# Patient Record
Sex: Female | Born: 1980 | Race: Black or African American | Hispanic: No | Marital: Single | Smoking: Current every day smoker
Health system: Southern US, Community
[De-identification: ages and names within clinical notes are randomized; demographics above are authoritative.]

## PROBLEM LIST (undated history)

## (undated) DIAGNOSIS — D649 Anemia, unspecified: Secondary | ICD-10-CM

## (undated) DIAGNOSIS — J45909 Unspecified asthma, uncomplicated: Secondary | ICD-10-CM

## (undated) DIAGNOSIS — I10 Essential (primary) hypertension: Secondary | ICD-10-CM

## (undated) HISTORY — PX: FRACTURE SURGERY: SHX138

## (undated) HISTORY — PX: TUBAL LIGATION: SHX77

---

## 2015-03-05 DIAGNOSIS — M5416 Radiculopathy, lumbar region: Secondary | ICD-10-CM | POA: Insufficient documentation

## 2015-03-05 DIAGNOSIS — G8929 Other chronic pain: Secondary | ICD-10-CM | POA: Insufficient documentation

## 2015-09-04 DIAGNOSIS — W19XXXA Unspecified fall, initial encounter: Secondary | ICD-10-CM | POA: Insufficient documentation

## 2019-02-10 ENCOUNTER — Encounter: Payer: Self-pay | Admitting: Intensive Care

## 2019-02-10 ENCOUNTER — Emergency Department
Admission: EM | Admit: 2019-02-10 | Discharge: 2019-02-11 | Disposition: A | Discharge status: Home / Self Care | Payer: BC Managed Care – PPO | Attending: Emergency Medicine | Admitting: Emergency Medicine

## 2019-02-10 ENCOUNTER — Other Ambulatory Visit: Payer: Self-pay

## 2019-02-10 DIAGNOSIS — Z202 Contact with and (suspected) exposure to infections with a predominantly sexual mode of transmission: Secondary | ICD-10-CM | POA: Diagnosis present

## 2019-02-10 DIAGNOSIS — A599 Trichomoniasis, unspecified: Secondary | ICD-10-CM | POA: Diagnosis not present

## 2019-02-10 LAB — URINALYSIS, COMPLETE (UACMP) WITH MICROSCOPIC
Bilirubin Urine: NEGATIVE
Glucose, UA: NEGATIVE mg/dL
Ketones, ur: NEGATIVE mg/dL
Nitrite: NEGATIVE
Protein, ur: NEGATIVE mg/dL
Specific Gravity, Urine: 1.024 (ref 1.005–1.030)
pH: 6 (ref 5.0–8.0)

## 2019-02-10 LAB — WET PREP, GENITAL
Sperm: NONE SEEN
Yeast Wet Prep HPF POC: NONE SEEN

## 2019-02-10 LAB — POCT PREGNANCY, URINE: Preg Test, Ur: NEGATIVE

## 2019-02-10 MED ORDER — ACETAMINOPHEN 325 MG PO TABS
650.0000 mg | ORAL_TABLET | Freq: Once | ORAL | Status: AC
  Administered 2019-02-10: 650 mg via ORAL
  Filled 2019-02-10: qty 2

## 2019-02-10 MED ORDER — METRONIDAZOLE 500 MG PO TABS
2000.0000 mg | ORAL_TABLET | Freq: Once | ORAL | Status: AC
  Administered 2019-02-10: 2000 mg via ORAL
  Filled 2019-02-10: qty 4

## 2019-02-10 MED ORDER — AZITHROMYCIN 500 MG PO TABS
1000.0000 mg | ORAL_TABLET | Freq: Once | ORAL | Status: AC
  Administered 2019-02-10: 1000 mg via ORAL
  Filled 2019-02-10: qty 2

## 2019-02-10 MED ORDER — CEFTRIAXONE SODIUM 250 MG IJ SOLR
250.0000 mg | Freq: Once | INTRAMUSCULAR | Status: AC
  Administered 2019-02-10: 250 mg via INTRAMUSCULAR
  Filled 2019-02-10: qty 250

## 2019-02-10 NOTE — ED Triage Notes (Signed)
PAtient c/o lower back pain and white discharge. Patient states she needs to be checked for gonorrhea. Partner told her yesterday he was diagnosed with gonorrhea.

## 2019-02-10 NOTE — ED Provider Notes (Signed)
[facility] Emergency Department Provider Note  ____________________________________________  Time seen: Approximately 9:08 PM  I have reviewed the triage vital signs and the nursing notes.   HISTORY  Chief Complaint Exposure to STD    HPI Sheila Valentine is a 38 y.o. female presents to the emergency department with new onset low back pain and increased vaginal discharge.  Patient states that her partner of 1 year has recently been diagnosed with gonorrhea.  Patient denies fever, chills, dyspareunia, dysuria, increased urinary frequency, abdominal pain or nausea.  She has not experienced similar symptoms in the past.  No other alleviating measures have been attempted.        History reviewed. No pertinent past medical history.  There are no active problems to display for this patient.   History reviewed. No pertinent surgical history.  Prior to Admission medications   Not on File    Allergies Penicillins  History reviewed. No pertinent family history.  Social History Social History   Tobacco Use  . Smoking status: Current Every Day Smoker    Types: Cigars  . Smokeless tobacco: Never Used  Substance Use Topics  . Alcohol use: Yes    Comment: occ  . Drug use: Never     Review of Systems  Constitutional: No fever/chills Eyes: No visual changes. No discharge ENT: No upper respiratory complaints. Cardiovascular: no chest pain. Respiratory: no cough. No SOB. Gastrointestinal: No abdominal pain.  No nausea, no vomiting.  No diarrhea.  No constipation. Genitourinary: Negative for dysuria. No hematuria. Patient has increased vaginal discharge.  Musculoskeletal: Patient has low back pain.  Skin: Negative for rash, abrasions, lacerations, ecchymosis. Neurological: Negative for headaches, focal weakness or numbness.   ____________________________________________   PHYSICAL EXAM:  VITAL SIGNS: ED Triage Vitals  Enc Vitals Group     BP  02/10/19 1740 136/83     Pulse Rate 02/10/19 1740 97     Resp 02/10/19 1740 16     Temp --      Temp src --      SpO2 02/10/19 1740 100 %     Weight 02/10/19 1736 165 lb (74.8 kg)     Height 02/10/19 1736 5\' 2"  (1.575 m)     Head Circumference --      Peak Flow --      Pain Score 02/10/19 1736 7     Pain Loc --      Pain Edu? --      Excl. in [place]? --      Constitutional: Alert and oriented. Well appearing and in no acute distress. Eyes: Conjunctivae are normal. PERRL. EOMI. Head: Atraumatic. Cardiovascular: Normal rate, regular rhythm. Normal S1 and S2.  Good peripheral circulation. Respiratory: Normal respiratory effort without tachypnea or retractions. Lungs CTAB. Good air entry to the bases with no decreased or absent breath sounds. Gastrointestinal: Bowel sounds 4 quadrants. Soft and nontender to palpation. No guarding or rigidity. No palpable masses. No distention. No CVA tenderness. Genitourinary: Patient has copious frothy white vaginal discharge. Musculoskeletal: Full range of motion to all extremities. No gross deformities appreciated. Neurologic:  Normal speech and language. No gross focal neurologic deficits are appreciated.  Skin:  Skin is warm, dry and intact. No rash noted. Psychiatric: Mood and affect are normal. Speech and behavior are normal. Patient exhibits appropriate insight and judgement.   ____________________________________________   LABS (all labs ordered are listed, but only abnormal results are displayed)  Labs Reviewed  WET PREP, GENITAL - Abnormal;  Notable for the following components:      Result Value   Trich, Wet Prep PRESENT (*)    Clue Cells Wet Prep HPF POC PRESENT (*)    WBC, Wet Prep HPF POC MODERATE (*)    All other components within normal limits  URINALYSIS, COMPLETE (UACMP) WITH MICROSCOPIC - Abnormal; Notable for the following components:   Color, Urine YELLOW (*)    APPearance HAZY (*)    Hgb urine dipstick SMALL (*)     Leukocytes,Ua TRACE (*)    Bacteria, UA RARE (*)    All other components within normal limits  CHLAMYDIA/NGC RT PCR ([facility] ONLY)  POC URINE PREG, ED  POCT PREGNANCY, URINE   ____________________________________________  EKG   ____________________________________________  RADIOLOGY   No results found.  ____________________________________________    PROCEDURES  Procedure(s) performed:    Procedures    Medications  cefTRIAXone (ROCEPHIN) injection 250 mg (250 mg Intramuscular Given 02/10/19 2226)  azithromycin (ZITHROMAX) tablet 1,000 mg (1,000 mg Oral Given 02/10/19 2225)  metroNIDAZOLE (FLAGYL) tablet 2,000 mg (2,000 mg Oral Given 02/10/19 2224)  acetaminophen (TYLENOL) tablet 650 mg (650 mg Oral Given 02/10/19 2224)     ____________________________________________   INITIAL IMPRESSION / ASSESSMENT AND PLAN / ED COURSE  Pertinent labs & imaging results that were available during my care of the patient were reviewed by me and considered in my medical decision making (see chart for details).  Review of the [place] CSRS was performed in accordance of the [place] prior to dispensing any controlled drugs.           Assessment and plan:  Exposure to STD:   38 year old female presents to the emergency department with increased vaginal discharge and low back pain for the past 2 to 3 days.  She states that her boyfriend recently tested positive for gonorrhea.  On physical exam, patient appeared to be resting comfortably.  Vital signs were reassuring.  Differential diagnosis included cystitis, STD, PID.Marland KitchenMarland Kitchen  Urinalysis was concerning with a small amount of blood and trace leuks.  Patient tested positive for trichomoniasis in the emergency department.  Patient opted to be tested empirically for gonorrhea, chlamydia and trichomoniasis with Rocephin, azithromycin and Flagyl.  Patient was advised to undergo repeat STD testing in 2 weeks.  All patient questions were  answered.  ____________________________________________  FINAL CLINICAL IMPRESSION(S) / ED DIAGNOSES  Final diagnoses:  Trichimoniasis  STD exposure      NEW MEDICATIONS STARTED DURING THIS VISIT:  ED Discharge Orders    None          This chart was dictated using voice recognition software/Dragon. Despite best efforts to proofread, errors can occur which can change the meaning. Any change was purely unintentional.    Lannie Fields, PA-C 02/10/19 2327    Delman Kitten, MD 02/11/19 [id number]

## 2019-02-11 LAB — CHLAMYDIA/NGC RT PCR (ARMC ONLY)
Chlamydia Tr: NOT DETECTED
N gonorrhoeae: NOT DETECTED

## 2020-04-30 ENCOUNTER — Other Ambulatory Visit: Payer: Self-pay

## 2020-04-30 ENCOUNTER — Emergency Department: Payer: Medicaid Other

## 2020-04-30 DIAGNOSIS — D259 Leiomyoma of uterus, unspecified: Secondary | ICD-10-CM | POA: Diagnosis not present

## 2020-04-30 DIAGNOSIS — F17290 Nicotine dependence, other tobacco product, uncomplicated: Secondary | ICD-10-CM | POA: Insufficient documentation

## 2020-04-30 DIAGNOSIS — Z20822 Contact with and (suspected) exposure to covid-19: Secondary | ICD-10-CM | POA: Diagnosis not present

## 2020-04-30 DIAGNOSIS — N920 Excessive and frequent menstruation with regular cycle: Secondary | ICD-10-CM | POA: Diagnosis not present

## 2020-04-30 DIAGNOSIS — N938 Other specified abnormal uterine and vaginal bleeding: Principal | ICD-10-CM | POA: Insufficient documentation

## 2020-04-30 DIAGNOSIS — R102 Pelvic and perineal pain: Secondary | ICD-10-CM | POA: Insufficient documentation

## 2020-04-30 DIAGNOSIS — D649 Anemia, unspecified: Secondary | ICD-10-CM | POA: Diagnosis not present

## 2020-04-30 DIAGNOSIS — I10 Essential (primary) hypertension: Secondary | ICD-10-CM | POA: Insufficient documentation

## 2020-04-30 DIAGNOSIS — N939 Abnormal uterine and vaginal bleeding, unspecified: Secondary | ICD-10-CM | POA: Diagnosis present

## 2020-04-30 LAB — CBC
HCT: 32.1 % — ABNORMAL LOW (ref 36.0–46.0)
Hemoglobin: 9.7 g/dL — ABNORMAL LOW (ref 12.0–15.0)
MCH: 23.5 pg — ABNORMAL LOW (ref 26.0–34.0)
MCHC: 30.2 g/dL (ref 30.0–36.0)
MCV: 77.9 fL — ABNORMAL LOW (ref 80.0–100.0)
Platelets: 409 10*3/uL — ABNORMAL HIGH (ref 150–400)
RBC: 4.12 MIL/uL (ref 3.87–5.11)
RDW: 17.8 % — ABNORMAL HIGH (ref 11.5–15.5)
WBC: 6.8 10*3/uL (ref 4.0–10.5)
nRBC: 0 % (ref 0.0–0.2)

## 2020-04-30 LAB — COMPREHENSIVE METABOLIC PANEL
ALT: 14 U/L (ref 0–44)
AST: 21 U/L (ref 15–41)
Albumin: 3.9 g/dL (ref 3.5–5.0)
Alkaline Phosphatase: 47 U/L (ref 38–126)
Anion gap: 9 (ref 5–15)
BUN: 14 mg/dL (ref 6–20)
CO2: 25 mmol/L (ref 22–32)
Calcium: 9.3 mg/dL (ref 8.9–10.3)
Chloride: 105 mmol/L (ref 98–111)
Creatinine, Ser: 0.85 mg/dL (ref 0.44–1.00)
GFR calc Af Amer: >60 mL/min (ref >60)
GFR calc non Af Amer: >60 mL/min (ref >60)
Glucose, Bld: 102 mg/dL — ABNORMAL HIGH (ref 70–99)
Potassium: 3.5 mmol/L (ref 3.5–5.1)
Sodium: 139 mmol/L (ref 135–145)
Total Bilirubin: 0.6 mg/dL (ref 0.3–1.2)
Total Protein: 7.9 g/dL (ref 6.5–8.1)

## 2020-04-30 LAB — HCG, QUANTITATIVE, PREGNANCY: hCG, Beta Chain, Quant, S: <1 m[IU]/mL (ref <5)

## 2020-04-30 MED ORDER — IBUPROFEN 800 MG PO TABS
800.0000 mg | ORAL_TABLET | Freq: Once | ORAL | Status: AC
  Administered 2020-04-30: 800 mg via ORAL

## 2020-04-30 NOTE — ED Triage Notes (Signed)
Pt comes POV with vaginal bleeding since last night. Pt states gushing of blood everytime she stands with cramping. Pt had tubes tied 17 years ago. Pt abdomen swollen and round, states that it is not normally like this.

## 2020-05-01 ENCOUNTER — Observation Stay
Admission: EM | Admit: 2020-05-01 | Discharge: 2020-05-02 | Disposition: A | Discharge status: Home / Self Care | Payer: Medicaid Other | Attending: Obstetrics and Gynecology | Admitting: Obstetrics and Gynecology

## 2020-05-01 ENCOUNTER — Encounter: Payer: Self-pay | Admitting: Obstetrics and Gynecology

## 2020-05-01 DIAGNOSIS — N938 Other specified abnormal uterine and vaginal bleeding: Secondary | ICD-10-CM

## 2020-05-01 DIAGNOSIS — N939 Abnormal uterine and vaginal bleeding, unspecified: Secondary | ICD-10-CM

## 2020-05-01 DIAGNOSIS — D259 Leiomyoma of uterus, unspecified: Secondary | ICD-10-CM

## 2020-05-01 DIAGNOSIS — N920 Excessive and frequent menstruation with regular cycle: Secondary | ICD-10-CM | POA: Diagnosis present

## 2020-05-01 LAB — CBC
HCT: 32.7 % — ABNORMAL LOW (ref 36.0–46.0)
Hemoglobin: 10.6 g/dL — ABNORMAL LOW (ref 12.0–15.0)
MCH: 24.7 pg — ABNORMAL LOW (ref 26.0–34.0)
MCHC: 32.4 g/dL (ref 30.0–36.0)
MCV: 76 fL — ABNORMAL LOW (ref 80.0–100.0)
Platelets: 306 10*3/uL (ref 150–400)
RBC: 4.3 MIL/uL (ref 3.87–5.11)
RDW: 18.6 % — ABNORMAL HIGH (ref 11.5–15.5)
WBC: 5.1 10*3/uL (ref 4.0–10.5)
nRBC: 0 % (ref 0.0–0.2)

## 2020-05-01 LAB — BASIC METABOLIC PANEL
Anion gap: 8 (ref 5–15)
BUN: 16 mg/dL (ref 6–20)
CO2: 23 mmol/L (ref 22–32)
Calcium: 8.6 mg/dL — ABNORMAL LOW (ref 8.9–10.3)
Chloride: 104 mmol/L (ref 98–111)
Creatinine, Ser: 0.87 mg/dL (ref 0.44–1.00)
GFR calc Af Amer: >60 mL/min (ref >60)
GFR calc non Af Amer: >60 mL/min (ref >60)
Glucose, Bld: 103 mg/dL — ABNORMAL HIGH (ref 70–99)
Potassium: 3.8 mmol/L (ref 3.5–5.1)
Sodium: 135 mmol/L (ref 135–145)

## 2020-05-01 LAB — FERRITIN: Ferritin: 4 ng/mL — ABNORMAL LOW (ref 11–307)

## 2020-05-01 LAB — TYPE AND SCREEN
ABO/RH(D): AB POS
Antibody Screen: NEGATIVE
PT AG Type: NEGATIVE
Unit division: 0
Unit division: 0

## 2020-05-01 LAB — BPAM RBC
Blood Product Expiration Date: 202109252359
Blood Product Expiration Date: 202109302359
Unit Type and Rh: 9500
Unit Type and Rh: 9500

## 2020-05-01 LAB — WET PREP, GENITAL
Clue Cells Wet Prep HPF POC: NONE SEEN
Sperm: NONE SEEN
Trich, Wet Prep: NONE SEEN
Yeast Wet Prep HPF POC: NONE SEEN

## 2020-05-01 LAB — SARS CORONAVIRUS 2 BY RT PCR (HOSPITAL ORDER, PERFORMED IN ~~LOC~~ HOSPITAL LAB): SARS Coronavirus 2: NEGATIVE

## 2020-05-01 LAB — HEMOGLOBIN AND HEMATOCRIT, BLOOD
HCT: 28.8 % — ABNORMAL LOW (ref 36.0–46.0)
Hemoglobin: 8.6 g/dL — ABNORMAL LOW (ref 12.0–15.0)

## 2020-05-01 LAB — POCT PREGNANCY, URINE: Preg Test, Ur: NEGATIVE

## 2020-05-01 LAB — PREPARE RBC (CROSSMATCH)

## 2020-05-01 MED ORDER — KETOROLAC TROMETHAMINE 30 MG/ML IJ SOLN
30.0000 mg | Freq: Three times a day (TID) | INTRAMUSCULAR | Status: DC | PRN
  Administered 2020-05-01 – 2020-05-02 (×2): 30 mg via INTRAVENOUS
  Filled 2020-05-01 (×2): qty 1

## 2020-05-01 MED ORDER — TRANEXAMIC ACID 650 MG PO TABS
1300.0000 mg | ORAL_TABLET | Freq: Three times a day (TID) | ORAL | Status: DC
  Administered 2020-05-01 – 2020-05-02 (×3): 1300 mg via ORAL
  Filled 2020-05-01 (×5): qty 2

## 2020-05-01 MED ORDER — SODIUM CHLORIDE 0.9 % IV SOLN: Freq: Once | INTRAVENOUS | Status: AC

## 2020-05-01 MED ORDER — PRENATAL MULTIVITAMIN CH
1.0000 | ORAL_TABLET | Freq: Every day | ORAL | Status: DC
  Administered 2020-05-02: 1 via ORAL
  Filled 2020-05-01: qty 1

## 2020-05-01 MED ORDER — KETOROLAC TROMETHAMINE 30 MG/ML IJ SOLN
15.0000 mg | Freq: Once | INTRAMUSCULAR | Status: AC
  Administered 2020-05-01: 15 mg via INTRAVENOUS
  Filled 2020-05-01: qty 1

## 2020-05-01 MED ORDER — HYDROCODONE-ACETAMINOPHEN 5-325 MG PO TABS
1.0000 | ORAL_TABLET | ORAL | Status: DC | PRN
  Administered 2020-05-01 – 2020-05-02 (×7): 1 via ORAL
  Filled 2020-05-01 (×7): qty 1

## 2020-05-01 MED ORDER — ACETAMINOPHEN 325 MG PO TABS
650.0000 mg | ORAL_TABLET | Freq: Once | ORAL | Status: AC
  Administered 2020-05-01: 650 mg via ORAL
  Filled 2020-05-01 (×2): qty 2

## 2020-05-01 MED ORDER — PNEUMOCOCCAL VAC POLYVALENT 25 MCG/0.5ML IJ INJ
0.5000 mL | INJECTION | INTRAMUSCULAR | Status: DC
  Filled 2020-05-01: qty 0.5

## 2020-05-01 MED ORDER — TRANEXAMIC ACID-NACL 1000-0.7 MG/100ML-% IV SOLN
1000.0000 mg | Freq: Once | INTRAVENOUS | Status: AC
  Administered 2020-05-01: 1000 mg via INTRAVENOUS
  Filled 2020-05-01: qty 100

## 2020-05-01 MED ORDER — DIPHENHYDRAMINE HCL 50 MG/ML IJ SOLN
25.0000 mg | Freq: Once | INTRAMUSCULAR | Status: AC
  Administered 2020-05-01: 25 mg via INTRAVENOUS
  Filled 2020-05-01: qty 1

## 2020-05-01 MED ORDER — TRANEXAMIC ACID-NACL 1000-0.7 MG/100ML-% IV SOLN
1000.0000 mg | Freq: Once | INTRAVENOUS | Status: DC
  Filled 2020-05-01: qty 100
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
## 2020-05-01 NOTE — Progress Notes (Signed)
1st unit pRBC started 0744.  Delayed due to pt losing blood bank bracelet & needing to redraw type/screen.

## 2020-05-01 NOTE — Progress Notes (Signed)
Subjective:admitted early this am for menorrhagia and symptomatic anemia . She has received 2 units of blood . Still with some bleeding . HCT 32% now post transfusion Patient reports cramping  .    Objective: I have reviewed patient's vital signs, intake and output, medications and labs.  GI: soft, non-tender; bowel sounds normal; no masses,  no organomegaly   Assessment/Plan:   LOS: 0 days   Blood count up since blood tranfsuion Offer nicoderm patch   add lysteda 1300 mg po tid  Goal d/c tomorrow with outpt workup Hysterectomy . Needs pap and embx   cheeck CBC in am    Sheila Valentine Emmanuela Ghazi 05/01/2020, 5:26 PM

## 2020-05-01 NOTE — ED Notes (Signed)
Messaged MD regarding pt's pain

## 2020-05-01 NOTE — Discharge Summary (Signed)
Physician Discharge Summary  Patient ID: Sheila Valentine MRN: [id number] DOB/AGE: December 13, 1980 39 y.o.  Admit date: 05/01/2020 Discharge date:05/02/2020  Admission Diagnoses: Symptomatic anemia Discharge Diagnoses:  Active Problems:   Menorrhagia symptomatic anemia  Discharged Condition: good  Hospital Course: pt was admitted and underwent 2 unit blood transfusion and initially was started on IV TXA transitioned to lysteda .   Consults: None  Significant Diagnostic Studies: labs:  Results for orders placed or performed during the hospital encounter of 05/01/20 (from the past 72 hour(s))  Pregnancy, urine POC     Status: None   Collection Time: 04/30/20  3:23 PM  Result Value Ref Range   Preg Test, Ur NEGATIVE NEGATIVE    Comment:        THE SENSITIVITY OF THIS METHODOLOGY IS >24 mIU/mL   CBC     Status: Abnormal   Collection Time: 04/30/20  3:24 PM  Result Value Ref Range   WBC 6.8 4.0 - 10.5 K/uL   RBC 4.12 3.87 - 5.11 MIL/uL   Hemoglobin 9.7 (L) 12.0 - 15.0 g/dL   HCT 32.1 (L) 36 - 46 %   MCV 77.9 (L) 80.0 - 100.0 fL   MCH 23.5 (L) 26.0 - 34.0 pg   MCHC 30.2 30.0 - 36.0 g/dL   RDW 17.8 (H) 11.5 - 15.5 %   Platelets 409 (H) 150 - 400 K/uL   nRBC 0.0 0.0 - 0.2 %    Comment: Performed at [facility], [address] [place], [place] [address]  Comprehensive metabolic panel     Status: Abnormal   Collection Time: 04/30/20  3:24 PM  Result Value Ref Range   Sodium 139 135 - 145 mmol/L   Potassium 3.5 3.5 - 5.1 mmol/L   Chloride 105 98 - 111 mmol/L   CO2 25 22 - 32 mmol/L   Glucose, Bld 102 (H) 70 - 99 mg/dL    Comment: Glucose reference range applies only to samples taken after fasting for at least 8 hours.   BUN 14 6 - 20 mg/dL   Creatinine, Ser 0.85 0.44 - 1.00 mg/dL   Calcium 9.3 8.9 - 10.3 mg/dL   Total Protein 7.9 6.5 - 8.1 g/dL   Albumin 3.9 3.5 - 5.0 g/dL   AST 21 15 - 41 U/L   ALT 14 0 - 44 U/L   Alkaline Phosphatase 47 38 - 126 U/L   Total  Bilirubin 0.6 0.3 - 1.2 mg/dL   GFR calc non Af Amer >60 >60 mL/min   GFR calc Af Amer >60 >60 mL/min   Anion gap 9 5 - 15    Comment: Performed at [facility], [address] [place], [place] [address]  Type and screen [place]     Status: None   Collection Time: 04/30/20  3:24 PM  Result Value Ref Range   ABO/RH(D) AB POS    Antibody Screen NEG    Sample Expiration 05/03/2020,2359    Antibody Identification ANTI A1    PT AG Type NEGATIVE FOR A1 ANTIGEN    Unit Number [id number]    Blood Component Type RED CELLS,LR    Unit division 00    Status of Unit REL FROM [facility]    Transfusion Status OK TO TRANSFUSE    Crossmatch Result COMPATIBLE    Unit Number [id number]    Blood Component Type RBC LR PHER1    Unit division 00    Status of Unit REL FROM [facility]  Transfusion Status OK TO TRANSFUSE    Crossmatch Result COMPATIBLE   hCG, quantitative, pregnancy     Status: None   Collection Time: 04/30/20  3:24 PM  Result Value Ref Range   hCG, Beta Chain, Quant, S <1 <5 mIU/mL    Comment:          GEST. AGE      CONC.  (mIU/mL)   <=1 WEEK        5 - 50     2 WEEKS       50 - 500     3 WEEKS       100 - 10,000     4 WEEKS     1,000 - 30,000     5 WEEKS     3,500 - 115,000   6-8 WEEKS     12,000 - 270,000    12 WEEKS     15,000 - 220,000        FEMALE AND NON-PREGNANT FEMALE:     LESS THAN 5 mIU/mL Performed at [facility], [address] [place], [place] [address]   Hemoglobin and hematocrit, blood     Status: Abnormal   Collection Time: 05/01/20 12:32 AM  Result Value Ref Range   Hemoglobin 8.6 (L) 12.0 - 15.0 g/dL   HCT 28.8 (L) 36 - 46 %    Comment: Performed at [facility], [address] [place], [place] [address]  Wet prep, genital     Status: Abnormal   Collection Time: 05/01/20  1:08 AM  Result Value Ref Range   Yeast Wet Prep HPF POC NONE SEEN NONE SEEN   Trich, Wet Prep NONE SEEN NONE SEEN   Clue Cells  Wet Prep HPF POC NONE SEEN NONE SEEN   WBC, Wet Prep HPF POC RARE (A) NONE SEEN   Sperm NONE SEEN     Comment: Performed at [facility], [address], [place],  [address]  SARS Coronavirus 2 by RT PCR (hospital order, performed in [facility] hospital lab) Nasopharyngeal Nasopharyngeal Swab     Status: None   Collection Time: 05/01/20  2:12 AM   Specimen: Nasopharyngeal Swab  Result Value Ref Range   SARS Coronavirus 2 NEGATIVE NEGATIVE    Comment: (NOTE) SARS-CoV-2 target nucleic acids are NOT DETECTED.  The SARS-CoV-2 RNA is generally detectable in upper and lower respiratory specimens during the acute phase of infection. The lowest concentration of SARS-CoV-2 viral copies this assay can detect is 250 copies / mL. A negative result does not preclude SARS-CoV-2 infection and should not be used as the sole basis for treatment or other patient management decisions.  A negative result may occur with improper specimen collection / handling, submission of specimen other than nasopharyngeal swab, presence of viral mutation(s) within the areas targeted by this assay, and inadequate number of viral copies (<250 copies / mL). A negative result must be combined with clinical observations, patient history, and epidemiological information.  Fact Sheet for Patients:   StrictlyIdeas.no  Fact Sheet for Healthcare Providers: BankingDealers.co.za  This test is not yet approved or  cleared by the [place] FDA and has been authorized for detection and/or diagnosis of SARS-CoV-2 by FDA under an Emergency Use Authorization (EUA).  This EUA will remain in effect (meaning this test can be used) for the duration of the COVID-19 declaration under Section 564(b)(1) of the Act, 21 U.S.C. section 360bbb-3(b)(1), unless the authorization is terminated or revoked sooner.  Performed at [facility], [address]  [place], [place] [address]   Prepare RBC (crossmatch)     Status: None   Collection Time: 05/01/20  2:26 AM  Result Value Ref Range   Order Confirmation      ORDER PROCESSED BY BLOOD BANK Performed at [facility], [address] [place], [place] [address]   Type and screen [place]     Status: None   Collection Time: 05/01/20  5:41 AM  Result Value Ref Range   ABO/RH(D) AB POS    Antibody Screen NEG    Sample Expiration 05/04/2020,2359    Unit Number [id number]    Blood Component Type RBC LR PHER1    Unit division 00    Status of Unit ISSUED,FINAL    Transfusion Status OK TO TRANSFUSE    Crossmatch Result COMPATIBLE    Unit Number [id number]    Blood Component Type RED CELLS,LR    Unit division 00    Status of Unit ISSUED,FINAL    Transfusion Status OK TO TRANSFUSE    Crossmatch Result COMPATIBLE   Basic metabolic panel     Status: Abnormal   Collection Time: 05/01/20  4:02 PM  Result Value Ref Range   Sodium 135 135 - 145 mmol/L   Potassium 3.8 3.5 - 5.1 mmol/L   Chloride 104 98 - 111 mmol/L   CO2 23 22 - 32 mmol/L   Glucose, Bld 103 (H) 70 - 99 mg/dL    Comment: Glucose reference range applies only to samples taken after fasting for at least 8 hours.   BUN 16 6 - 20 mg/dL   Creatinine, Ser 0.87 0.44 - 1.00 mg/dL   Calcium 8.6 (L) 8.9 - 10.3 mg/dL   GFR calc non Af Amer >60 >60 mL/min   GFR calc Af Amer >60 >60 mL/min   Anion gap 8 5 - 15    Comment: Performed at [facility], [address] [place], [place] [address]  CBC     Status: Abnormal   Collection Time: 05/01/20  4:02 PM  Result Value Ref Range   WBC 5.1 4.0 - 10.5 K/uL   RBC 4.30 3.87 - 5.11 MIL/uL   Hemoglobin 10.6 (L) 12.0 - 15.0 g/dL   HCT 32.7 (L) 36 - 46 %   MCV 76.0 (L) 80.0 - 100.0 fL   MCH 24.7 (L) 26.0 - 34.0 pg   MCHC 32.4 30.0 - 36.0 g/dL   RDW 18.6 (H) 11.5 - 15.5 %   Platelets 306 150 - 400 K/uL   nRBC 0.0 0.0 - 0.2 %    Comment: Performed  at [facility], [address] [place], [place] [address]  Ferritin     Status: Abnormal   Collection Time: 05/01/20  4:02 PM  Result Value Ref Range   Ferritin 4 (L) 11 - 307 ng/mL    Comment: Performed at [facility], [address] [place],  [address]  CBC     Status: Abnormal   Collection Time: 05/02/20  6:31 AM  Result Value Ref Range   WBC 6.1 4.0 - 10.5 K/uL   RBC 4.58 3.87 - 5.11 MIL/uL   Hemoglobin 11.3 (L) 12.0 - 15.0 g/dL   HCT 34.5 (L) 36 - 46 %   MCV 75.3 (L) 80.0 - 100.0 fL   MCH 24.7 (L) 26.0 - 34.0 pg   MCHC 32.8 30.0 - 36.0 g/dL   RDW 18.4 (H)  11.5 - 15.5 %   Platelets 304 150 - 400 K/uL   nRBC 0.0 0.0 - 0.2 %    Comment: Performed at [facility], [address] [place],  [address]    Treatments: blood transfusion  Discharge Exam: Blood pressure 140/86, pulse 66, temperature 98.4 F (36.9 C), temperature source Oral, resp. rate 18, height 5\' 2"  (1.575 m), weight 77.1 kg, SpO2 100 %. physical Lungs CTA   Cv RRR  abd soft NT  Disposition:  D/c home  FUP at [facility] Monday 05/05/20 for EMBX / PAP and schedule surgery   Allergies as of 05/02/2020      Reactions   Amoxicillin Hives   Metronidazole Hives   Penicillins    rash      Medication List    TAKE these medications   albuterol 108 (90 Base) MCG/ACT inhaler Commonly known as: VENTOLIN HFA Inhale 2 puffs into the lungs every 4 (four) hours as needed.   ferrous sulfate 325 (65 FE) MG tablet Take 1 tablet (325 mg total) by mouth 2 (two) times daily with a meal.   tranexamic acid 650 MG Tabs tablet Commonly known as: LYSTEDA Take 2 tablets (1,300 mg total) by mouth 3 (three) times daily for 3 days.       Follow-up Information    Vida Nicol, Gwen Her, MD Follow up on 05/05/2020.   Specialty: Obstetrics and Gynecology Why: 0845 AT [facility] information: [address] [place]  [address] [phone]               Signed: Gwen Her Brigido Mera 05/02/2020, 12:58 PM

## 2020-05-01 NOTE — ED Provider Notes (Signed)
[facility] Emergency Department Provider Note  ____________________________________________  Time seen: Approximately 1:23 AM  I have reviewed the triage vital signs and the nursing notes.   HISTORY  Chief Complaint Vaginal Bleeding   HPI Sheila Valentine is a 39 y.o. female history of hypertension presents for evaluation of vaginal bleeding.  Symptoms have been ongoing for 24 hours.  Patient reports large amount of bleeding and clots every time she stands up or goes to the bathroom.  She has been feeling dizzy.  She denies any prior history of abnormal uterine bleeding.  Patient denies any known history of coagulopathy or bleeding disorder.  She does have a strong family history of blood clots.  Patient is also complaining of abdominal distention and diffuse cramping severe and constant abdominal pain.   PMH HTN  Allergies Penicillins  FH Mother - DVT  Social History Social History   Tobacco Use   Smoking status: Current Every Day Smoker    Types: Cigars   Smokeless tobacco: Never Used  Substance Use Topics   Alcohol use: Yes    Comment: occ   Drug use: Never    Review of Systems  Constitutional: Negative for fever. + Lightheadedness Eyes: Negative for visual changes. ENT: Negative for sore throat. Neck: No neck pain  Cardiovascular: Negative for chest pain. Respiratory: Negative for shortness of breath. Gastrointestinal: Negative for abdominal pain, vomiting or diarrhea. Genitourinary: Negative for dysuria. + Vaginal bleeding  musculoskeletal: Negative for back pain. Skin: Negative for rash. Neurological: Negative for headaches, weakness or numbness. Psych: No SI or HI  ____________________________________________   PHYSICAL EXAM:  VITAL SIGNS: ED Triage Vitals  Enc Vitals Group     BP 04/30/20 1513 127/84     Pulse Rate 04/30/20 1513 94     Resp 04/30/20 1513 18     Temp 04/30/20 1513 98.6 F (37 C)     Temp Source  04/30/20 1513 Oral     SpO2 04/30/20 1513 100 %     Weight 04/30/20 1514 170 lb (77.1 kg)     Height 04/30/20 1514 5\' 2"  (1.575 m)     Head Circumference --      Peak Flow --      Pain Score 04/30/20 1520 8     Pain Loc --      Pain Edu? --      Excl. in [place]? --     Constitutional: Alert and oriented. Well appearing and in no apparent distress. HEENT:      Head: Normocephalic and atraumatic.         Eyes: Conjunctivae are normal. Sclera is non-icteric.       Mouth/Throat: Mucous membranes are moist.       Neck: Supple with no signs of meningismus. Cardiovascular: Regular rate and rhythm. No murmurs, gallops, or rubs.  Respiratory: Normal respiratory effort. Lungs are clear to auscultation bilaterally.  Gastrointestinal: Soft, distended with lower tenderness on palpation. No rebound or guarding. Musculoskeletal:  No edema, cyanosis, or erythema of extremities. Neurologic: Normal speech and language. Face is symmetric. Moving all extremities. No gross focal neurologic deficits are appreciated. Skin: Skin is warm, dry and intact. No rash noted. Psychiatric: Mood and affect are normal. Speech and behavior are normal.  ____________________________________________   LABS (all labs ordered are listed, but only abnormal results are displayed)  Labs Reviewed  CBC - Abnormal; Notable for the following components:      Result Value   Hemoglobin 9.7 (*)  HCT 32.1 (*)    MCV 77.9 (*)    MCH 23.5 (*)    RDW 17.8 (*)    Platelets 409 (*)    All other components within normal limits  COMPREHENSIVE METABOLIC PANEL - Abnormal; Notable for the following components:   Glucose, Bld 102 (*)    All other components within normal limits  HEMOGLOBIN AND HEMATOCRIT, BLOOD - Abnormal; Notable for the following components:   Hemoglobin 8.6 (*)    HCT 28.8 (*)    All other components within normal limits  WET PREP, GENITAL  CHLAMYDIA/NGC RT PCR ([facility] ONLY)  SARS CORONAVIRUS 2 BY RT  PCR (HOSPITAL ORDER, [place] [facility])  HCG, QUANTITATIVE, PREGNANCY  POC URINE PREG, ED  TYPE AND SCREEN   ____________________________________________  EKG  none  ____________________________________________  RADIOLOGY  I have personally reviewed the images performed during this visit and I agree with the Radiologist's read.   Interpretation by Radiologist:  US PELVIC COMPLETE WITH TRANSVAGINAL  Result Date: 04/30/2020 CLINICAL DATA:  Pelvic cramping for 2 days.  Vaginal bleeding. EXAM: TRANSABDOMINAL AND TRANSVAGINAL ULTRASOUND OF PELVIS TECHNIQUE: Both transabdominal and transvaginal ultrasound examinations of the pelvis were performed. Transabdominal technique was performed for global imaging of the pelvis including uterus, ovaries, adnexal regions, and pelvic cul-de-sac. It was necessary to proceed with endovaginal exam following the transabdominal exam to visualize the ovaries and adnexa. COMPARISON:  None FINDINGS: Uterus Measurements: 13.5 x 6.2 x 8.3 cm = volume: 363 mL. There are multiple uterine fibroids. Largest fibroid is at the anterior fundus measuring 6.1 x 7.6 x 5.9 cm. Posterior fundal fibroids measured 2.6 x 2.3 x 2.3 cm and 3.2 x 2.6 x 2.8 cm. Myometrial texture is diffusely heterogeneous. Endometrium Thickness: 6-8 mm, normal.  No focal abnormality visualized. Right ovary Measurements: 3.6 x 2.0 x 2.2 cm = volume: 8.1 mL. The ovary is visualized on both transabdominal and transvaginal imaging and appears normal. Blood flow is seen. No cyst or solid mass. No adnexal mass. Left ovary Tentatively visualized transabdominally measuring 2.7 x 2.5 x 2.4 cm. Left ovary is not visualized transvaginally. There is no evidence of adnexal mass. Other findings No abnormal free fluid. IMPRESSION: 1. Enlarged fibroid uterus with largest 7.6 cm fibroid at the anterior fundus. 2. Normal endometrium. 3. Normal right ovary. Left ovary is tentatively visualized  transabdominally, and normal. No suspicious adnexal mass. Electronically Signed   By: Keith Rake M.D.   On: 04/30/2020 18:18     ____________________________________________   PROCEDURES  Procedure(s) performed:yes .1-3 Lead EKG Interpretation Performed by: Rudene Re, MD Authorized by: Rudene Re, MD     Interpretation: normal     ECG rate assessment: normal     Rhythm: sinus rhythm     Critical Care performed:  None ____________________________________________   INITIAL IMPRESSION / ASSESSMENT AND PLAN / ED COURSE  39 y.o. female history of hypertension presents for evaluation of vaginal bleeding.  Patient hemodynamically stable but hemoglobin is dropping.  Her baseline is 10.5.  Upon arrival to the ED hemoglobin found to be 9.7 with a repeat of 8.6.  On vaginal exam patient has pretty significant amount of blood in the vaginal vault with constant oozing from the os.  Due to strong family history of DVT I do not feel that giving IV TXA or high-dose birth control is the safest option for this patient.  Therefore with drop in hemoglobin, I consulted OB/GYN and spoke with Dr. Ouida Sills for admission  of the patient.  Patient is in agreement.  Discussed my plan with her.  Old medical records reviewed.       _____________________________________________ Please note:  Patient was evaluated in Emergency Department today for the symptoms described in the history of present illness. Patient was evaluated in the context of the global COVID-19 pandemic, which necessitated consideration that the patient might be at risk for infection with the SARS-CoV-2 virus that causes COVID-19. Institutional protocols and algorithms that pertain to the evaluation of patients at risk for COVID-19 are in a state of rapid change based on information released by regulatory bodies including the CDC and federal and state organizations. These policies and algorithms were followed during the  patient's care in the ED.  Some ED evaluations and interventions may be delayed as a result of limited staffing during the pandemic.   [place] Controlled Substance Database was reviewed by me. ____________________________________________   FINAL CLINICAL IMPRESSION(S) / ED DIAGNOSES   Final diagnoses:  Dysfunctional uterine bleeding  Uterine leiomyoma, unspecified location      NEW MEDICATIONS STARTED DURING THIS VISIT:  ED Discharge Orders    None       Note:  This document was prepared using Dragon voice recognition software and may include unintentional dictation errors.    Alfred Levins, [place], MD 05/01/20 [phone]

## 2020-05-01 NOTE — Progress Notes (Signed)
Pt off unit in wheelchair escorted by her sister. States she, "feel better and need to get out of this bed for some fresh air"

## 2020-05-01 NOTE — Progress Notes (Signed)
Pt has received 2 units pRBC today, Hgb 10.6 (from 8.6). Pt has been resting all day. No c/o dizziness. Cramping controlled by PRN Norco & Kpad.  Tearful when asked how she is feeling this afternoon.  "I don't want to go home and die" Now stating "I feel lightheaded" VSS, scant bleeding. Dr. Ouida Sills updated, will come round on pt this evening.

## 2020-05-01 NOTE — Progress Notes (Addendum)
1st unit pRBC stopped 1000. No c/o dizziness, SOB, or chest tightness.  Scant bleeding since admission to [facility] Provider paged to clarify orders going forward.  Waiting for response.  1056: Spoke with Dr. Ouida Sills, see order changes.

## 2020-05-02 LAB — BPAM RBC
Blood Product Expiration Date: 202110082359
Blood Product Expiration Date: 202110112359
ISSUE DATE / TIME: 202109090732
ISSUE DATE / TIME: 202109091110
Unit Type and Rh: 5100
Unit Type and Rh: 5100

## 2020-05-02 LAB — TYPE AND SCREEN
ABO/RH(D): AB POS
Antibody Screen: NEGATIVE
Unit division: 0
Unit division: 0

## 2020-05-02 LAB — CBC
HCT: 34.5 % — ABNORMAL LOW (ref 36.0–46.0)
Hemoglobin: 11.3 g/dL — ABNORMAL LOW (ref 12.0–15.0)
MCH: 24.7 pg — ABNORMAL LOW (ref 26.0–34.0)
MCHC: 32.8 g/dL (ref 30.0–36.0)
MCV: 75.3 fL — ABNORMAL LOW (ref 80.0–100.0)
Platelets: 304 10*3/uL (ref 150–400)
RBC: 4.58 MIL/uL (ref 3.87–5.11)
RDW: 18.4 % — ABNORMAL HIGH (ref 11.5–15.5)
WBC: 6.1 10*3/uL (ref 4.0–10.5)
nRBC: 0 % (ref 0.0–0.2)

## 2020-05-02 MED ORDER — FERROUS SULFATE 325 (65 FE) MG PO TABS: 325.0000 mg | ORAL_TABLET | Freq: Two times a day (BID) | ORAL | 3 refills | Status: DC

## 2020-05-02 MED ORDER — TRANEXAMIC ACID 650 MG PO TABS: 1300.0000 mg | ORAL_TABLET | Freq: Three times a day (TID) | ORAL | 1 refills | Status: AC

## 2020-05-02 NOTE — Progress Notes (Signed)
Patient discharged home. Discharge instructions, prescriptions and follow up appointment given to and reviewed with patient. Patient verbalized understanding. Pt wheeled out by auxiliary.  

## 2020-05-02 NOTE — Discharge Instructions (Signed)
Abnormal Uterine Bleeding °Abnormal uterine bleeding means bleeding more than usual from your uterus. It can include: °· Bleeding between periods. °· Bleeding after sex. °· Bleeding that is heavier than normal. °· Periods that last longer than usual. °· Bleeding after you have stopped having your period (menopause). °There are many problems that may cause this. You should see a doctor for any kind of bleeding that is not normal. Treatment depends on the cause of the bleeding. °Follow these instructions at home: °· Watch your condition for any changes. °· Do not use tampons, douche, or have sex, if your doctor tells you not to. °· Change your pads often. °· Get regular well-woman exams. Make sure they include a pelvic exam and cervical cancer screening. °· Keep all follow-up visits as told by your doctor. This is important. °Contact a doctor if: °· The bleeding lasts more than one week. °· You feel dizzy at times. °· You feel like you are going to throw up (nauseous). °· You throw up. °Get help right away if: °· You pass out. °· You have to change pads every hour. °· You have belly (abdominal) pain. °· You have a fever. °· You get sweaty. °· You get weak. °· You passing large blood clots from your vagina. °Summary °· Abnormal uterine bleeding means bleeding more than usual from your uterus. °· There are many problems that may cause this. You should see a doctor for any kind of bleeding that is not normal. °· Treatment depends on the cause of the bleeding. °This information is not intended to replace advice given to you by your health care provider. Make sure you discuss any questions you have with your health care provider. °Document Revised: 08/03/2016 Document Reviewed: 08/03/2016 °Elsevier Patient Education © 2020 [employer] ° °

## 2020-05-05 ENCOUNTER — Other Ambulatory Visit: Payer: Self-pay | Admitting: Obstetrics and Gynecology

## 2020-05-05 NOTE — H&P (Signed)
Sheila Valentine is a 39 y.o. female here for TAH and bilateral salpingectomy  Hospital followup-menorrhagia . Hospital admission last week for acute abd pain , menorrhagia and fibroid utx . Multiple fibroids noted on u/s last week Pain is not better  And bleeding has stopped on lysteda .  2 unit blood transfusion in hospital last week  For symptomatic anemia    G4P3  Pelvic u/s on 04/30/20: EXAM: TRANSABDOMINAL AND TRANSVAGINAL ULTRASOUND OF PELVIS  TECHNIQUE: Both transabdominal and transvaginal ultrasound examinations of the pelvis were performed. Transabdominal technique was performed for global imaging of the pelvis including uterus, ovaries, adnexal regions, and pelvic cul-de-sac. It was necessary to proceed with endovaginal exam following the transabdominal exam to visualize the ovaries and adnexa.  COMPARISON:  None  FINDINGS: Uterus  Measurements: 13.5 x 6.2 x 8.3 cm = volume: 363 mL. There are multiple uterine fibroids. Largest fibroid is at the anterior fundus measuring 6.1 x 7.6 x 5.9 cm. Posterior fundal fibroids measured 2.6 x 2.3 x 2.3 cm and 3.2 x 2.6 x 2.8 cm. Myometrial texture is diffusely heterogeneous.  Endometrium  Thickness: 6-8 mm, normal.  No focal abnormality visualized.  Right ovary  Measurements: 3.6 x 2.0 x 2.2 cm = volume: 8.1 mL. The ovary is visualized on both transabdominal and transvaginal imaging and appears normal. Blood flow is seen. No cyst or solid mass. No adnexal mass.  Left ovary  Tentatively visualized transabdominally measuring 2.7 x 2.5 x 2.4 cm. Left ovary is not visualized transvaginally. There is no evidence of adnexal mass.  Other findings  No abnormal free fluid.   Past Medical History:  has a past medical history of Hypertension and Nerve pain.  Past Surgical History:  has a past surgical history that includes Laparoscopic tubal ligation; other surgery; orif right leg (Right); and Fracture surgery. Family  History: family history is not on file. Social History:  reports that she quit smoking about 4 years ago. Her smoking use included cigars. She smoked 0.50 packs per day. She has never used smokeless tobacco. She reports previous alcohol use. She reports that she does not use drugs. OB/GYN History:  OB History    Gravida  4   Para  3   Term      Preterm      AB  1   Living  3     SAB      TAB      Ectopic      Molar      Multiple      Live Births  3          Allergies: is allergic to amoxicillin and flagyl [metronidazole hcl]. Medications:  Current Outpatient Medications:  .  ferrous sulfate 325 (65 FE) MG tablet, Take by mouth, Disp: , Rfl:  .  tranexamic acid (LYSTEDA) 650 mg tablet, Take by mouth, Disp: , Rfl:  .  albuterol 90 mcg/actuation inhaler, Inhale 2 inhalations into the lungs every 4 (four) hours as needed for Wheezing or Shortness of Breath, Disp: 1 Inhaler, Rfl: 0 .  cyclobenzaprine (FLEXERIL) 10 MG tablet, TK 1 T PO TID PRN FOR MSP (Patient not taking: Reported on 05/05/2020), Disp: , Rfl: 0 .  traMADol (ULTRAM) 50 mg tablet, Take 50 mg by mouth every 6 (six) hours as needed for Pain (Patient not taking: Reported on 05/05/2020  ), Disp: , Rfl:   Review of Systems: General:   No fatigue or weight loss Eyes:   No  vision changes Ears:   No hearing difficulty Respiratory:                No cough or shortness of breath Pulmonary:   No asthma or shortness of breath Cardiovascular:        No chest pain, palpitations, dyspnea on exertion Gastrointestinal:          No abdominal bloating, chronic diarrhea, constipations, masses, pain or hematochezia Genitourinary:  No hematuria, dysuria, abnormal vaginal discharge, pelvic pain, Menometrorrhagia, + pelvic pain . + menorrhagia Lymphatic:  No swollen lymph nodes Musculoskeletal: No muscle weakness Neurologic:  No extremity weakness, syncope, seizure disorder Psychiatric:  No history of depression, delusions or  suicidal/homicidal ideation    Exam:   Vitals:   05/05/20 1406  BP: 145/89  Pulse: 84    Body mass index is 30.61 kg/m.  WDWN  black female in NAD   Lungs: CTA  CV : RRR without murmur    Neck:  no thyromegaly Abdomen: soft , no mass, normal active bowel sounds,  + ttp , no rebound tenderness Pelvic: tanner stage 5 ,  External genitalia: vulva /labia no lesions Urethra: no prolapse Vagina: grey d/c  Cervix: no lesions, no cervical motion tenderness   Uterus: irregular size shape and contour,++ TTP . 15 week  Adnexa: no mass,  non-tender   Rectovaginal Endometrial biopsy: The cervix was cleaned with betadine and a single tooth tenaculum is applied to the anterior cervix. The Pipelle catheter was placed into the endometrial cavity. It sounds to 9 cm and adequate tissue was removed.   Impression:   The primary encounter diagnosis was Leukorrhea. Diagnoses of Menorrhagia with irregular cycle, Intramural leiomyoma of uterus, Symptomatic anemia, unspecified, Menorrhagia with regular cycle, and Cervical cancer screening were also pertinent to this visit.    Plan:   embx / pap   I have spoken to her about definitive surgery . We have decided on an TAH , bilateral salpingectomy based on uterine size . Benefits and risks to surgery: The proposed benefit of the surgery has been discussed with the patient. The possible risks include, but are not limited to: organ injury to the bowel , bladder, ureters, and major blood vessels and nerves. There is a possibility of additional surgeries resulting from these injuries. There is also the risk of blood transfusion and the need to receive blood products during or after the procedure which may rarely lead to HIV or Hepatitis C infection. There is a risk of developing a deep venous thrombosis or a pulmonary embolism . There is the possibility of wound infection and also anesthetic complications, even the rare possibility of death. The patient  understands these risks and wishes to proceed. All questions have been answered and the consent has been signed.    Caroline Sauger, MD

## 2020-05-06 ENCOUNTER — Encounter
Admission: RE | Admit: 2020-05-06 | Discharge: 2020-05-06 | Disposition: A | Discharge status: Home / Self Care | Payer: Medicaid Other | Source: Ambulatory Visit | Attending: Obstetrics and Gynecology | Admitting: Obstetrics and Gynecology

## 2020-05-06 ENCOUNTER — Other Ambulatory Visit
Admission: RE | Admit: 2020-05-06 | Discharge: 2020-05-06 | Disposition: A | Discharge status: Home / Self Care | Payer: Medicaid Other | Source: Ambulatory Visit | Attending: Obstetrics and Gynecology | Admitting: Obstetrics and Gynecology

## 2020-05-06 ENCOUNTER — Other Ambulatory Visit: Payer: Self-pay

## 2020-05-06 DIAGNOSIS — Z01812 Encounter for preprocedural laboratory examination: Secondary | ICD-10-CM | POA: Insufficient documentation

## 2020-05-06 DIAGNOSIS — Z20822 Contact with and (suspected) exposure to covid-19: Secondary | ICD-10-CM | POA: Diagnosis not present

## 2020-05-06 HISTORY — DX: Anemia, unspecified: D64.9

## 2020-05-06 HISTORY — DX: Essential (primary) hypertension: I10

## 2020-05-06 HISTORY — DX: Unspecified asthma, uncomplicated: J45.909

## 2020-05-06 LAB — CBC
HCT: 36.1 % (ref 36.0–46.0)
Hemoglobin: 11.4 g/dL — ABNORMAL LOW (ref 12.0–15.0)
MCH: 24.9 pg — ABNORMAL LOW (ref 26.0–34.0)
MCHC: 31.6 g/dL (ref 30.0–36.0)
MCV: 79 fL — ABNORMAL LOW (ref 80.0–100.0)
Platelets: 354 10*3/uL (ref 150–400)
RBC: 4.57 MIL/uL (ref 3.87–5.11)
RDW: 19.3 % — ABNORMAL HIGH (ref 11.5–15.5)
WBC: 8.4 10*3/uL (ref 4.0–10.5)
nRBC: 0 % (ref 0.0–0.2)

## 2020-05-06 LAB — BASIC METABOLIC PANEL
Anion gap: 10 (ref 5–15)
BUN: 14 mg/dL (ref 6–20)
CO2: 24 mmol/L (ref 22–32)
Calcium: 9.1 mg/dL (ref 8.9–10.3)
Chloride: 103 mmol/L (ref 98–111)
Creatinine, Ser: 0.89 mg/dL (ref 0.44–1.00)
GFR calc Af Amer: >60 mL/min (ref >60)
GFR calc non Af Amer: >60 mL/min (ref >60)
Glucose, Bld: 161 mg/dL — ABNORMAL HIGH (ref 70–99)
Potassium: 3.1 mmol/L — ABNORMAL LOW (ref 3.5–5.1)
Sodium: 137 mmol/L (ref 135–145)

## 2020-05-06 MED ORDER — GENTAMICIN SULFATE 40 MG/ML IJ SOLN
5.0000 mg/kg | INTRAVENOUS | Status: AC
  Administered 2020-05-07: 390 mg via INTRAVENOUS
  Filled 2020-05-06: qty 9.75

## 2020-05-06 MED ORDER — CLINDAMYCIN PHOSPHATE 900 MG/50ML IV SOLN
900.0000 mg | INTRAVENOUS | Status: AC
  Administered 2020-05-07: 900 mg via INTRAVENOUS

## 2020-05-06 NOTE — Progress Notes (Addendum)
  [facility] Perioperative Services: Pre-Admission/Anesthesia Testing   Date: 05/06/20  Name: Sheila Valentine MRN:   [id number]  Re: Consideration of perioperative therapeutic ABX change in patient with PCN allergy  Request sent to: Schermerhorn, Gwen Her, * Notification mode: Routed and/or faxed via [facility]   Procedure: HYSTERECTOMY ABDOMINAL WITH SALPINGECTOMY (Bilateral ) Date of procedure: 05/07/2020  Notes: 1. Patient has a documented allergy to PCN.  2. Advising that PCN has caused her to experience mild to moderate rash in the past.  3. Received cephalosporin on 02/10/2019 with no documented complications. 4. Screened as appropriate for cephalosporin use during medication reconciliation . No immediate angioedema, dysphagia, SOB, or anaphylaxis symptoms. . No severe rash involving mucous membranes or skin necrosis. . No hospital admissions related to side effects of PCN/cephalosporin use.  . No documented reaction to PCN or cephalosporin in the last 10 years.  Currently ordered preoperative prophylactic ABX: clindamycin and gentamicin  Request: As an evidence based approach to reducing the rate of incidence for post-operative SSI and the development of MDROs, could an agent with narrower coverage for preoperative prophylaxis in this patient's upcoming surgical course be considered?  . Specifically requesting change to cephalosporin (CEFAZOLIN).  . Please have your staff communicate decision with me and I would be happy to change the orders in Epic as per your directives.   Things to consider: . Many patients report that they were "allergic" to PCN earlier in life, however this does not translate into a true lifelong allergy. Patients can lose sensitivity to specific IgE antibodies over time if PCN is avoided (Kleris & Lugar, 2019).  Marland Kitchen Up to 10% of the adult population and 15% of hospitalized patients report an allergy to PCN, however clinical studies suggest  that 90% of those reporting an allergy can tolerate PCN antibiotics (Kleris & Lugar, 2019).  . Cross-sensitivity between PCN and cephalosporins has been documented as being as high as 10%, however this estimation included data believed to have been collected in a setting where there was contamination. Newer data suggests that the prevalence of cross-sensitivity between PCN and cephalosporins is actually estimated to be closer to 1% (Hermanides et al., 2018).   . Patients labeled as PCN allergic, whether they are truly allergic or not, have been found to have inferior outcomes in terms of rates of serious infection, and these patients tend to have longer hospital stays (Dooms, 2019).  . Treatment related secondary infections, such as Clostridioides difficile, have been linked to the improper use of broad spectrum antibiotics in patients improperly labeled as PCN allergic (Kleris & Lugar, 2019).  Marland Kitchen Anaphylaxis from cephalosporins is rare and the evidence suggests that there is no increased risk of an anaphylactic type reaction when cephalosporins are used in a PCN allergic patient (Pichichero, 2006).   Citations Hermanides J, Lemkes BA, Prins JM, Hollmann MW, Terreehorst I. Presumed ?-Lactam Allergy and Cross-reactivity in the Operating Theater: A Practical Approach. Anesthesiology. 2018 Aug;129(2):335-342. doi: 10.1097/ALN.0000000000002252. PMID: 79024097.  Kleris, [place]., & Lugar, P. L. (2019). Things We Do For No Reason: Failing to Question a Penicillin Allergy History. Journal of hospital medicine, 14(10), [phone]. Advance online publication. https://www.wallace-middleton.info/  Pichichero, M. E. (2006). Cephalosporins can be prescribed safely for penicillin-allergic patients. Journal of family medicine, 55(2), 106-112. Accessed: https://cdn.mdedge.com/files/s45fs-public/Document/September-2017/5502JFP_AppliedEvidence1.pdf  Honor Loh, MSN, APRN, FNP-C, CEN [facility]   Peri-operative Services Nurse Practitioner Phone: [phone] 05/06/20 3:34 PM

## 2020-05-06 NOTE — Patient Instructions (Addendum)
Your procedure is scheduled on: tomorrow Report to Day Surgery. At 10:00   Remember: Instructions that are not followed completely may result in serious medical risk,  up to and including death, or upon the discretion of your surgeon and anesthesiologist your  surgery may need to be rescheduled.     _X__ 1. Do not eat food after midnight the night before your procedure.                 No chewing gum or hard candies. You may drink clear liquids up to 2 hours                 before you are scheduled to arrive for your surgery- DO not drink clear                 liquids within 2 hours of the start of your surgery.                 Clear Liquids include:  water, apple juice without pulp, clear Gatorade, G2 or                  Gatorade Zero (avoid Red/Purple/Blue), Black Coffee or Tea (Do not add     x            anything to coffee or tea). _____2.   Complete the "Ensure Clear Pre-surgery Clear Carbohydrate Drink" provided to you, 2 hours before arrival.   __X__2.  On the morning of surgery brush your teeth with toothpaste and water, you                may rinse your mouth with mouthwash if you wish.  Do not swallow any toothpaste of mouthwash.     _X__ 3.  No Alcohol for 24 hours before or after surgery.   __x_ 4.  Do Not Smoke or use e-cigarettes For 24 Hours Prior to Your Surgery.                 Do not use any chewable tobacco products for at least 6 hours prior to                 Surgery.  ___  5.  Do not use any recreational drugs (marijuana, cocaine, heroin, ecstasy, MDMA or other)                For at least one week prior to your surgery.  Combination of these drugs with anesthesia                May have life threatening results.  ____  6.  Bring all medications with you on the day of surgery if instructed.   __x__  7.  Notify your doctor if there is any change in your medical condition      (cold, fever, infections).     Do not wear jewelry, make-up,  hairpins, clips or nail polish. Do not wear lotions, powders, or perfumes. You may wear deodorant. Do not shave 48 hours prior to surgery. Men may shave face and neck. Do not bring valuables to the hospital.    [facility] is not responsible for any belongings or valuables.  Contacts, dentures or bridgework may not be worn into surgery. Leave your suitcase in the car. After surgery it may be brought to your room. For patients admitted to the hospital, discharge time is determined by your treatment team.   Patients discharged the day of surgery will  not be allowed to drive home.   Make arrangements for someone to be with you for the first 24 hours of your Same Day Discharge.    Please read over the following fact sheets that you were given:   Incentive spirometer    __x__ Take these medicines the morning of surgery with A SIP OF WATER:    1. none  2.   3.   4.  5.  6.  ____ Fleet Enema (as directed)   __x__ Use CHG Soap (or wipes) as directed  ____ Use Benzoyl Peroxide Gel as instructed  __x__ Use inhalers on the day of surgery and bring with you to the hospital  ____ Stop metformin 2 days prior to surgery    ____ Take 1/2 of usual insulin dose the night before surgery. No insulin the morning          of surgery.   ____ Stop Coumadin/Plavix/aspirin on   __x__ Stop Anti-inflammatories ibuprofen aleve aspirin until after surgery   __x__ Stop supplements until after surgery.    ____ Bring C-Pap to the hospital.    If you have any questions regarding your pre-procedure instructions,  Please call Pre-admit Testing at [phone]

## 2020-05-06 NOTE — Progress Notes (Signed)
°  [facility] Perioperative Services: Pre-Admission/Anesthesia Testing  Abnormal Lab Notification   Date: 05/06/20  Name: Laure Leone MRN:   [id number]  Re: Abnormal labs noted during PAT appointment   Provider(s) Notified: Schermerhorn, Gwen Her, * Notification mode: Routed and/or faxed via [facility] LAB VALUE(S): Lab Results  Component Value Date   K 3.1 (L) 05/06/2020    Notes:  Patient scheduled for surgery in the morning (05/07/2020). In review of patient's current medications, she is not on any type of diuretic medications. Forwarding to surgeon for review and optimization. Will place order for K+ to be rechecked prior to procedure. This is a Community education officer; no formal response is required.  Honor Loh, MSN, APRN, FNP-C, CEN [facility]  Peri-operative Services Nurse Practitioner Phone: [phone] 05/06/20 3:58 PM

## 2020-05-07 ENCOUNTER — Inpatient Hospital Stay
Admission: RE | Admit: 2020-05-07 | Discharge: 2020-05-10 | DRG: 743 | Disposition: A | Discharge status: Home / Self Care | Payer: Medicaid Other | Attending: Obstetrics and Gynecology | Admitting: Obstetrics and Gynecology

## 2020-05-07 ENCOUNTER — Other Ambulatory Visit: Payer: Self-pay

## 2020-05-07 ENCOUNTER — Encounter
Admission: RE | Disposition: A | Discharge status: Home / Self Care | Payer: Self-pay | Source: Home / Self Care | Attending: Obstetrics and Gynecology

## 2020-05-07 ENCOUNTER — Encounter: Payer: Self-pay | Admitting: Obstetrics and Gynecology

## 2020-05-07 ENCOUNTER — Inpatient Hospital Stay: Payer: Medicaid Other | Admitting: Urgent Care

## 2020-05-07 DIAGNOSIS — I10 Essential (primary) hypertension: Secondary | ICD-10-CM | POA: Diagnosis present

## 2020-05-07 DIAGNOSIS — Z880 Allergy status to penicillin: Secondary | ICD-10-CM

## 2020-05-07 DIAGNOSIS — Z20822 Contact with and (suspected) exposure to covid-19: Secondary | ICD-10-CM | POA: Diagnosis present

## 2020-05-07 DIAGNOSIS — N921 Excessive and frequent menstruation with irregular cycle: Principal | ICD-10-CM | POA: Diagnosis present

## 2020-05-07 DIAGNOSIS — A5901 Trichomonal vulvovaginitis: Secondary | ICD-10-CM | POA: Diagnosis present

## 2020-05-07 DIAGNOSIS — D649 Anemia, unspecified: Secondary | ICD-10-CM | POA: Diagnosis present

## 2020-05-07 DIAGNOSIS — Z87891 Personal history of nicotine dependence: Secondary | ICD-10-CM

## 2020-05-07 DIAGNOSIS — D251 Intramural leiomyoma of uterus: Secondary | ICD-10-CM | POA: Diagnosis present

## 2020-05-07 DIAGNOSIS — N920 Excessive and frequent menstruation with regular cycle: Secondary | ICD-10-CM | POA: Diagnosis present

## 2020-05-07 DIAGNOSIS — Z9889 Other specified postprocedural states: Secondary | ICD-10-CM

## 2020-05-07 HISTORY — PX: HYSTERECTOMY ABDOMINAL WITH SALPINGECTOMY: SHX6725

## 2020-05-07 LAB — TYPE AND SCREEN
ABO/RH(D): AB POS
Antibody Screen: NEGATIVE
Extend sample reason: TRANSFUSED

## 2020-05-07 LAB — CBC
HCT: 39.1 % (ref 36.0–46.0)
Hemoglobin: 12.3 g/dL (ref 12.0–15.0)
MCH: 25.3 pg — ABNORMAL LOW (ref 26.0–34.0)
MCHC: 31.5 g/dL (ref 30.0–36.0)
MCV: 80.3 fL (ref 80.0–100.0)
Platelets: 315 10*3/uL (ref 150–400)
RBC: 4.87 MIL/uL (ref 3.87–5.11)
RDW: 20 % — ABNORMAL HIGH (ref 11.5–15.5)
WBC: 14.4 10*3/uL — ABNORMAL HIGH (ref 4.0–10.5)
nRBC: 0 % (ref 0.0–0.2)

## 2020-05-07 LAB — CREATININE, SERUM
Creatinine, Ser: 0.87 mg/dL (ref 0.44–1.00)
GFR calc Af Amer: >60 mL/min (ref >60)
GFR calc non Af Amer: >60 mL/min (ref >60)

## 2020-05-07 LAB — POCT PREGNANCY, URINE: Preg Test, Ur: NEGATIVE

## 2020-05-07 LAB — POCT I-STAT, CHEM 8
BUN: 12 mg/dL (ref 6–20)
Calcium, Ion: 1.17 mmol/L (ref 1.15–1.40)
Chloride: 105 mmol/L (ref 98–111)
Creatinine, Ser: 0.8 mg/dL (ref 0.44–1.00)
Glucose, Bld: 91 mg/dL (ref 70–99)
HCT: 39 % (ref 36.0–46.0)
Hemoglobin: 13.3 g/dL (ref 12.0–15.0)
Potassium: 3.6 mmol/L (ref 3.5–5.1)
Sodium: 140 mmol/L (ref 135–145)
TCO2: 22 mmol/L (ref 22–32)

## 2020-05-07 LAB — SARS CORONAVIRUS 2 BY RT PCR (HOSPITAL ORDER, PERFORMED IN ~~LOC~~ HOSPITAL LAB): SARS Coronavirus 2: NEGATIVE

## 2020-05-07 SURGERY — HYSTERECTOMY, TOTAL, ABDOMINAL, WITH SALPINGECTOMY
Anesthesia: General | Laterality: Bilateral

## 2020-05-07 MED ORDER — BUPIVACAINE HCL (PF) 0.5 % IJ SOLN
INTRAMUSCULAR | Status: AC
  Filled 2020-05-07: qty 30

## 2020-05-07 MED ORDER — MORPHINE SULFATE (PF) 2 MG/ML IV SOLN: 2.0000 mg | Freq: Once | INTRAVENOUS | Status: DC

## 2020-05-07 MED ORDER — PROPOFOL 500 MG/50ML IV EMUL
INTRAVENOUS | Status: AC
  Filled 2020-05-07: qty 50

## 2020-05-07 MED ORDER — EPHEDRINE SULFATE 50 MG/ML IJ SOLN
INTRAMUSCULAR | Status: DC | PRN
  Administered 2020-05-07: 5 mg via INTRAVENOUS

## 2020-05-07 MED ORDER — KETOROLAC TROMETHAMINE 30 MG/ML IJ SOLN
30.0000 mg | Freq: Four times a day (QID) | INTRAMUSCULAR | Status: AC
  Administered 2020-05-07 – 2020-05-08 (×3): 30 mg via INTRAVENOUS
  Filled 2020-05-07 (×3): qty 1

## 2020-05-07 MED ORDER — FENTANYL CITRATE (PF) 100 MCG/2ML IJ SOLN
INTRAMUSCULAR | Status: DC | PRN
  Administered 2020-05-07 (×3): 50 ug via INTRAVENOUS

## 2020-05-07 MED ORDER — MORPHINE SULFATE 2 MG/ML IV SOLN
INTRAVENOUS | Status: DC
  Administered 2020-05-07: 10.96 mg via INTRAVENOUS
  Administered 2020-05-08: 6.85 mg via INTRAVENOUS
  Filled 2020-05-07: qty 30

## 2020-05-07 MED ORDER — DIPHENHYDRAMINE HCL 50 MG/ML IJ SOLN
INTRAMUSCULAR | Status: AC
  Administered 2020-05-07: 12.5 mg via INTRAVENOUS
  Filled 2020-05-07: qty 1

## 2020-05-07 MED ORDER — FENTANYL CITRATE (PF) 100 MCG/2ML IJ SOLN
INTRAMUSCULAR | Status: AC
  Administered 2020-05-07: 25 ug via INTRAVENOUS
  Filled 2020-05-07: qty 2

## 2020-05-07 MED ORDER — FAMOTIDINE 20 MG PO TABS
ORAL_TABLET | ORAL | Status: AC
Start: 2020-05-07 — End: 2020-05-07
  Administered 2020-05-07: 20 mg via ORAL
  Filled 2020-05-07: qty 1

## 2020-05-07 MED ORDER — ONDANSETRON HCL 4 MG/2ML IJ SOLN: 4.0000 mg | Freq: Four times a day (QID) | INTRAMUSCULAR | Status: DC | PRN

## 2020-05-07 MED ORDER — LACTATED RINGERS IV SOLN: INTRAVENOUS | Status: DC

## 2020-05-07 MED ORDER — ENOXAPARIN SODIUM 40 MG/0.4ML ~~LOC~~ SOLN
40.0000 mg | SUBCUTANEOUS | Status: DC
  Administered 2020-05-08 – 2020-05-09 (×2): 40 mg via SUBCUTANEOUS
  Filled 2020-05-07 (×2): qty 0.4

## 2020-05-07 MED ORDER — MORPHINE SULFATE 2 MG/ML IV SOLN
INTRAVENOUS | Status: DC
  Filled 2020-05-07 (×2): qty 30

## 2020-05-07 MED ORDER — GABAPENTIN 300 MG PO CAPS
ORAL_CAPSULE | ORAL | Status: AC
  Administered 2020-05-07: 300 mg via ORAL
  Filled 2020-05-07: qty 1

## 2020-05-07 MED ORDER — SUGAMMADEX SODIUM 200 MG/2ML IV SOLN
INTRAVENOUS | Status: DC | PRN
  Administered 2020-05-07: 200 mg via INTRAVENOUS

## 2020-05-07 MED ORDER — DIPHENHYDRAMINE HCL 50 MG/ML IJ SOLN
12.5000 mg | Freq: Four times a day (QID) | INTRAMUSCULAR | Status: DC | PRN
  Administered 2020-05-07 – 2020-05-08 (×3): 12.5 mg via INTRAVENOUS
  Filled 2020-05-07 (×3): qty 1

## 2020-05-07 MED ORDER — KETOROLAC TROMETHAMINE 30 MG/ML IJ SOLN
INTRAMUSCULAR | Status: AC
  Filled 2020-05-07: qty 1

## 2020-05-07 MED ORDER — METRONIDAZOLE IN NACL 5-0.79 MG/ML-% IV SOLN
500.0000 mg | Freq: Three times a day (TID) | INTRAVENOUS | Status: DC
  Administered 2020-05-07 – 2020-05-08 (×2): 500 mg via INTRAVENOUS
  Filled 2020-05-07 (×8): qty 100

## 2020-05-07 MED ORDER — FENTANYL CITRATE (PF) 100 MCG/2ML IJ SOLN
INTRAMUSCULAR | Status: AC
  Filled 2020-05-07: qty 2

## 2020-05-07 MED ORDER — FAMOTIDINE 20 MG PO TABS: 20.0000 mg | ORAL_TABLET | Freq: Once | ORAL | Status: AC

## 2020-05-07 MED ORDER — BUPIVACAINE HCL 0.5 % IJ SOLN
INTRAMUSCULAR | Status: DC | PRN
  Administered 2020-05-07: 15 mL

## 2020-05-07 MED ORDER — GABAPENTIN 300 MG PO CAPS
300.0000 mg | ORAL_CAPSULE | Freq: Every day | ORAL | Status: DC
  Administered 2020-05-07 – 2020-05-09 (×3): 300 mg via ORAL
  Filled 2020-05-07 (×3): qty 1

## 2020-05-07 MED ORDER — ORAL CARE MOUTH RINSE: 15.0000 mL | Freq: Once | OROMUCOSAL | Status: AC

## 2020-05-07 MED ORDER — EPHEDRINE 5 MG/ML INJ
INTRAVENOUS | Status: AC
  Filled 2020-05-07: qty 10

## 2020-05-07 MED ORDER — DIPHENHYDRAMINE HCL 50 MG/ML IJ SOLN
INTRAMUSCULAR | Status: DC | PRN
  Administered 2020-05-07: 12.5 mg via INTRAVENOUS

## 2020-05-07 MED ORDER — DEXAMETHASONE SODIUM PHOSPHATE 10 MG/ML IJ SOLN
INTRAMUSCULAR | Status: DC | PRN
  Administered 2020-05-07: 8 mg via INTRAVENOUS

## 2020-05-07 MED ORDER — ACETAMINOPHEN 500 MG PO TABS
1000.0000 mg | ORAL_TABLET | Freq: Four times a day (QID) | ORAL | Status: DC
  Administered 2020-05-07: 1000 mg via ORAL
  Filled 2020-05-07: qty 2

## 2020-05-07 MED ORDER — PROPOFOL 10 MG/ML IV BOLUS
INTRAVENOUS | Status: DC | PRN
  Administered 2020-05-07: 40 mg via INTRAVENOUS
  Administered 2020-05-07: 160 mg via INTRAVENOUS
  Administered 2020-05-07: 50 mg via INTRAVENOUS

## 2020-05-07 MED ORDER — SODIUM CHLORIDE 0.9 % IV SOLN
INTRAVENOUS | Status: DC | PRN
  Administered 2020-05-07: 50 mL

## 2020-05-07 MED ORDER — ACETAMINOPHEN 500 MG PO TABS
1000.0000 mg | ORAL_TABLET | Freq: Four times a day (QID) | ORAL | Status: DC
  Administered 2020-05-07 – 2020-05-10 (×10): 1000 mg via ORAL
  Filled 2020-05-07 (×11): qty 2

## 2020-05-07 MED ORDER — CHLORHEXIDINE GLUCONATE 0.12 % MT SOLN
OROMUCOSAL | Status: AC
  Administered 2020-05-07: 15 mL via OROMUCOSAL
  Filled 2020-05-07: qty 15

## 2020-05-07 MED ORDER — POVIDONE-IODINE 10 % EX SWAB
2.0000 "application " | Freq: Once | CUTANEOUS | Status: AC
  Administered 2020-05-07: 2 via TOPICAL

## 2020-05-07 MED ORDER — ONDANSETRON HCL 4 MG PO TABS: 4.0000 mg | ORAL_TABLET | Freq: Four times a day (QID) | ORAL | Status: DC | PRN

## 2020-05-07 MED ORDER — DIPHENHYDRAMINE HCL 50 MG/ML IJ SOLN
INTRAMUSCULAR | Status: AC
  Filled 2020-05-07: qty 1

## 2020-05-07 MED ORDER — IBUPROFEN 800 MG PO TABS
800.0000 mg | ORAL_TABLET | Freq: Four times a day (QID) | ORAL | Status: DC
  Administered 2020-05-08 – 2020-05-10 (×8): 800 mg via ORAL
  Filled 2020-05-07 (×8): qty 1

## 2020-05-07 MED ORDER — MIDAZOLAM HCL 2 MG/2ML IJ SOLN
INTRAMUSCULAR | Status: AC
  Filled 2020-05-07: qty 2

## 2020-05-07 MED ORDER — CLINDAMYCIN PHOSPHATE 900 MG/50ML IV SOLN
INTRAVENOUS | Status: AC
  Filled 2020-05-07: qty 50

## 2020-05-07 MED ORDER — ROCURONIUM BROMIDE 100 MG/10ML IV SOLN
INTRAVENOUS | Status: DC | PRN
  Administered 2020-05-07: 50 mg via INTRAVENOUS
  Administered 2020-05-07: 20 mg via INTRAVENOUS
  Administered 2020-05-07: 10 mg via INTRAVENOUS

## 2020-05-07 MED ORDER — ONDANSETRON HCL 4 MG/2ML IJ SOLN
INTRAMUSCULAR | Status: DC | PRN
  Administered 2020-05-07: 4 mg via INTRAVENOUS

## 2020-05-07 MED ORDER — GABAPENTIN 300 MG PO CAPS: 300.0000 mg | ORAL_CAPSULE | ORAL | Status: AC

## 2020-05-07 MED ORDER — SODIUM CHLORIDE 0.9% FLUSH: 9.0000 mL | INTRAVENOUS | Status: DC | PRN

## 2020-05-07 MED ORDER — LIDOCAINE HCL (CARDIAC) PF 100 MG/5ML IV SOSY
PREFILLED_SYRINGE | INTRAVENOUS | Status: DC | PRN
  Administered 2020-05-07: 40 mg via INTRAVENOUS
  Administered 2020-05-07: 60 mg via INTRAVENOUS

## 2020-05-07 MED ORDER — NALOXONE HCL 0.4 MG/ML IJ SOLN
0.4000 mg | INTRAMUSCULAR | Status: DC | PRN
  Filled 2020-05-07: qty 1

## 2020-05-07 MED ORDER — KETOROLAC TROMETHAMINE 30 MG/ML IJ SOLN
30.0000 mg | Freq: Four times a day (QID) | INTRAMUSCULAR | Status: DC
  Administered 2020-05-07: 30 mg via INTRAVENOUS
  Filled 2020-05-07: qty 1

## 2020-05-07 MED ORDER — PROPOFOL 500 MG/50ML IV EMUL
INTRAVENOUS | Status: DC | PRN
  Administered 2020-05-07: 25 ug/kg/min via INTRAVENOUS

## 2020-05-07 MED ORDER — CHLORHEXIDINE GLUCONATE 0.12 % MT SOLN: 15.0000 mL | Freq: Once | OROMUCOSAL | Status: AC

## 2020-05-07 MED ORDER — ACETAMINOPHEN 500 MG PO TABS
ORAL_TABLET | ORAL | Status: AC
  Administered 2020-05-07: 1000 mg via ORAL
  Filled 2020-05-07: qty 2

## 2020-05-07 MED ORDER — FENTANYL CITRATE (PF) 100 MCG/2ML IJ SOLN
25.0000 ug | INTRAMUSCULAR | Status: AC | PRN
  Administered 2020-05-07 (×6): 25 ug via INTRAVENOUS

## 2020-05-07 MED ORDER — SIMETHICONE 80 MG PO CHEW
80.0000 mg | CHEWABLE_TABLET | Freq: Four times a day (QID) | ORAL | Status: DC | PRN
  Administered 2020-05-08 – 2020-05-09 (×3): 80 mg via ORAL
  Filled 2020-05-07 (×3): qty 1

## 2020-05-07 MED ORDER — ACETAMINOPHEN 500 MG PO TABS: 1000.0000 mg | ORAL_TABLET | ORAL | Status: AC

## 2020-05-07 MED ORDER — BUPIVACAINE LIPOSOME 1.3 % IJ SUSP
INTRAMUSCULAR | Status: AC
  Filled 2020-05-07: qty 20

## 2020-05-07 MED ORDER — MORPHINE SULFATE (PF) 2 MG/ML IV SOLN
2.0000 mg | Freq: Once | INTRAVENOUS | Status: AC
  Administered 2020-05-07: 2 mg via INTRAVENOUS
  Filled 2020-05-07: qty 1

## 2020-05-07 MED ORDER — KETOROLAC TROMETHAMINE 30 MG/ML IJ SOLN
INTRAMUSCULAR | Status: DC | PRN
  Administered 2020-05-07: 30 mg via INTRAVENOUS

## 2020-05-07 MED ORDER — DIPHENHYDRAMINE HCL 12.5 MG/5ML PO ELIX
12.5000 mg | ORAL_SOLUTION | Freq: Four times a day (QID) | ORAL | Status: DC | PRN
  Filled 2020-05-07: qty 5

## 2020-05-07 MED ORDER — MIDAZOLAM HCL 2 MG/2ML IJ SOLN
INTRAMUSCULAR | Status: DC | PRN
  Administered 2020-05-07: 2 mg via INTRAVENOUS

## 2020-05-07 MED ORDER — IBUPROFEN 800 MG PO TABS: 800.0000 mg | ORAL_TABLET | Freq: Four times a day (QID) | ORAL | Status: DC

## 2020-05-07 MED ORDER — ONDANSETRON HCL 4 MG/2ML IJ SOLN: 4.0000 mg | Freq: Once | INTRAMUSCULAR | Status: DC | PRN

## 2020-05-07 MED ORDER — METRONIDAZOLE IN NACL 5-0.79 MG/ML-% IV SOLN
500.0000 mg | Freq: Three times a day (TID) | INTRAVENOUS | Status: DC
  Administered 2020-05-07: 500 mg via INTRAVENOUS
  Filled 2020-05-07 (×3): qty 100

## 2020-05-07 MED ORDER — SODIUM CHLORIDE (PF) 0.9 % IJ SOLN
INTRAMUSCULAR | Status: AC
  Filled 2020-05-07: qty 50

## 2020-05-07 SURGICAL SUPPLY — 49 items
CANISTER SUCT 1200ML W/VALVE (MISCELLANEOUS) ×2 IMPLANT
CHLORAPREP W/TINT 26 (MISCELLANEOUS) ×2 IMPLANT
COVER LIGHT HANDLE STERIS (MISCELLANEOUS) ×2 IMPLANT
COVER WAND RF STERILE (DRAPES) ×2 IMPLANT
DRAPE LAP W/FLUID (DRAPES) ×2 IMPLANT
DRAPE LAPAROTOMY TRNSV 106X77 (MISCELLANEOUS) ×2 IMPLANT
DRAPE LEGGINS SURG 28X43 STRL (DRAPES) ×2 IMPLANT
DRAPE UNDER BUTTOCK W/FLU (DRAPES) ×2 IMPLANT
DRSG TELFA 3X8 NADH (GAUZE/BANDAGES/DRESSINGS) ×2 IMPLANT
ELECT BLADE 6.5 EXT (BLADE) ×2 IMPLANT
ELECT CAUTERY BLADE TIP 2.5 (TIP) ×2
ELECT REM PT RETURN 9FT ADLT (ELECTROSURGICAL) ×2
ELECTRODE CAUTERY BLDE TIP 2.5 (TIP) ×1 IMPLANT
ELECTRODE REM PT RTRN 9FT ADLT (ELECTROSURGICAL) ×1 IMPLANT
GAUZE 4X4 16PLY RFD (DISPOSABLE) ×4 IMPLANT
GAUZE SPONGE 4X4 12PLY STRL (GAUZE/BANDAGES/DRESSINGS) ×2 IMPLANT
GLOVE BIO SURGEON STRL SZ7 (GLOVE) ×2 IMPLANT
GLOVE BIOGEL PI IND STRL 6.5 (GLOVE) ×1 IMPLANT
GLOVE BIOGEL PI INDICATOR 6.5 (GLOVE) ×1
GLOVE SURG SYN 8.0 (GLOVE) ×2 IMPLANT
GOWN STRL REUS W/ TWL LRG LVL3 (GOWN DISPOSABLE) ×2 IMPLANT
GOWN STRL REUS W/ TWL XL LVL3 (GOWN DISPOSABLE) ×1 IMPLANT
GOWN STRL REUS W/TWL LRG LVL3 (GOWN DISPOSABLE) ×2
GOWN STRL REUS W/TWL XL LVL3 (GOWN DISPOSABLE) ×1
KIT TURNOVER CYSTO (KITS) ×2 IMPLANT
LABEL OR SOLS (LABEL) ×2 IMPLANT
NEEDLE HYPO 22GX1.5 SAFETY (NEEDLE) ×4 IMPLANT
PACK BASIN MAJOR ARMC (MISCELLANEOUS) ×2 IMPLANT
PENCIL SMOKE ULTRAEVAC 22 CON (MISCELLANEOUS) ×2 IMPLANT
RETAINER VISCERA MED (MISCELLANEOUS) IMPLANT
SET CYSTO W/LG BORE CLAMP LF (SET/KITS/TRAYS/PACK) ×2 IMPLANT
SOL PREP PVP 2OZ (MISCELLANEOUS) ×2
SOLUTION PREP PVP 2OZ (MISCELLANEOUS) ×1 IMPLANT
STAPLER INSORB 30 2030 C-SECTI (MISCELLANEOUS) ×2 IMPLANT
STAPLER SKIN PROX 35W (STAPLE) IMPLANT
SURGILUBE 2OZ TUBE FLIPTOP (MISCELLANEOUS) ×2 IMPLANT
SUT CHROMIC 2 0 SH (SUTURE) ×2 IMPLANT
SUT PDS AB 1 TP1 96 (SUTURE) ×4 IMPLANT
SUT VIC AB 0 CT1 27 (SUTURE) ×4
SUT VIC AB 0 CT1 27XCR 8 STRN (SUTURE) ×4 IMPLANT
SUT VIC AB 0 CT1 36 (SUTURE) ×4 IMPLANT
SUT VIC AB 2-0 SH 27 (SUTURE) ×1
SUT VIC AB 2-0 SH 27XBRD (SUTURE) ×1 IMPLANT
SUT VICRYL PLUS ABS 0 54 (SUTURE) IMPLANT
SYR 20ML LL LF (SYRINGE) ×4 IMPLANT
SYR 30ML LL (SYRINGE) ×2 IMPLANT
SYR BULB IRRIG 60ML STRL (SYRINGE) ×2 IMPLANT
TRAY FOLEY MTR SLVR 16FR STAT (SET/KITS/TRAYS/PACK) ×2 IMPLANT
WATER STERILE IRR 1000ML POUR (IV SOLUTION) ×2 IMPLANT

## 2020-05-07 NOTE — Progress Notes (Signed)
Patient ID: Sheila Valentine, female   DOB: 08-05-1981, 39 y.o.   MRN: [id number] Pt is 4 hr post op .  Delay in PCA getting to floor . ++ pain  Currently  O; VSS Abd: appropriately TTP  A; post op  Pain control  Poor P; 2 mg Morphone IV . PCA arrived as morphine was pushed .  Continue orders .  Labs in am

## 2020-05-07 NOTE — Progress Notes (Signed)
Pt is ready for TAH , bilateral salpingectomy  Npo labs reviewed   recent trichomas vaginalis . I will treat with IV flagyl. Pt sates that she received flagyl 2020 with only rash , no Hives , no SOB , tongue swelling  Proceed

## 2020-05-07 NOTE — Anesthesia Procedure Notes (Signed)
Procedure Name: Intubation Date/Time: 05/07/2020 12:04 PM Performed by: Markus Daft, RN Pre-anesthesia Checklist: Patient identified, Emergency Drugs available, Suction available and Patient being monitored Patient Re-evaluated:Patient Re-evaluated prior to induction Oxygen Delivery Method: Circle system utilized Preoxygenation: Pre-oxygenation with 100% oxygen Induction Type: IV induction Ventilation: Mask ventilation without difficulty Laryngoscope Size: McGraph and 3 Grade View: Grade I Tube type: Oral Tube size: 7.0 mm Number of attempts: 1 Airway Equipment and Method: Stylet,  Oral airway and Video-laryngoscopy Placement Confirmation: ETT inserted through vocal cords under direct vision,  positive ETCO2 and breath sounds checked- equal and bilateral Secured at: 21 cm Tube secured with: Tape Dental Injury: Teeth and Oropharynx as per pre-operative assessment

## 2020-05-07 NOTE — Op Note (Signed)
NAME: Sheila Valentine, Sheila Valentine MEDICAL RECORD UQ:[id number] ACCOUNT [id number] DATE OF BIRTH:10/27/1980 FACILITY: [facility] LOCATION: [facility] PHYSICIAN:Hawley Michel Josefine Class, MD  OPERATIVE REPORT  DATE OF PROCEDURE:  05/07/2020  PREOPERATIVE DIAGNOSES: 1.  Symptomatic fibroid uterus. 2.  Pelvic pain. 3.  Menorrhagia.  POSTOPERATIVE DIAGNOSES: 1.  Symptomatic fibroid uterus.   2.  Pelvic pain.  3.  Menorrhagia.  PROCEDURE:  Total abdominal hysterectomy and bilateral salpingectomy.  SURGEON:  Laverta Baltimore, MD.  FIRST ASSISTANT:  Benjaman Kindler, MD  SECOND ASSISTANT:  PA student, Joaquin Bend.  ANESTHESIA:  General endotracheal anesthesia.  INDICATIONS:  A 39 year old female gravida 4, para 3 patient with a preceding admission to [facility] with significant menorrhagia, symptomatic anemia, known fibroid uterus and pelvic pain.  The patient was discharged from the  hospital and had a workup outpatient 3 days later, now approximately 1 week from admission is back for definitive surgery for her fibroid uterus and menorrhagia.  DESCRIPTION OF PROCEDURE:  After adequate general endotracheal anesthesia, the patient was placed in dorsal supine position, legs in the [place] stirrups.  The patient's abdomen, perineum and vagina were prepped and draped in normal sterile fashion.  Foley  catheter had been previously placed.  The patient was prophylaxed with gentamicin and clindamycin antibiotics for surgical prophylaxis.  Timeout was performed.  A Pfannenstiel incision was then made 2 fingerbreadths above the symphysis pubis.  Sharp  dissection was used to identify the fascia and fascia was opened bilaterally in a transverse fashion.  The superior aspect of the fascia was grasped with Kocher clamps and the recti muscles were dissected free.  Inferior aspect of the fascia was grasped  with Kocher clamps and the pyramidalis muscle was dissected free.  Entry into the peritoneal  cavity was accomplished sharply.  The O'Connor-O'Sullivan retractor was then brought up to the operative field and the bowel was packed cephalad.  The 2 large  Kelly clamps were placed on the uterine cornua and were used for retraction.  The round ligaments on both sides were clamped, transected, suture ligated with 0 Vicryl suture.  Anterior leaf of the broad ligament was incised along the bladder reflection  to the midline from both sides.  The bladder was gently dissected off the lower uterine segment with sharp and blunt traction.  A window was made in the broad ligament bilaterally to incorporate the uteroovarian ligament.  The pedicle was transected and  suture ligated with 0 Vicryl suture.  The uterine arteries were then bilaterally skeletonized and clamped, transected and suture ligated with 0 Vicryl suture.  The angles of the cervix were then clamped and the uterus was delivered with scissors.  Of  note, the large portion of the uterus was amputated to facilitate exposure and clamping the vaginal angles.  The cervical stump was removed with the previously amputated uterus.  Vaginal cuff was then closed with interrupted 0 Vicryl sutures with good  hemostasis, good approximation of tissues.  There was normal peristaltic activity of both ureters.  Each distal fallopian tube was clamped with a Heaney Ballantine clamp was removed and suture ligated the pedicle.  The patient's abdomen was then  copiously irrigated.  Good hemostasis was noted and all sutures were cut.  Laparotomy sponges were removed from the abdomen and the O'Connor-O'Sullivan retractor was removed.  The fascia was then closed with a running 0 Vicryl suture; 2 separate sutures  were used.  The fascial edges were then injected with an Exparel solution of 20 mL of 1.3% Exparel  plus 30 mL of 0.5% Marcaine plus 50 mL normal saline.  Fifty mL of this solution was injected.  Subcutaneous tissues were irrigated and bovied for  hemostasis.  Given  the depth of the subcutaneous tissues of 3 cm, subcutaneous tissue dead space was closed with a 2-0 chromic suture.  Skin was reapproximated with Insorb absorbable staples.  Good cosmetic effect.  COMPLICATIONS:  There were no complications.  ESTIMATED BLOOD LOSS:  50 mL.  INTRAOPERATIVE FLUIDS:  800 mL.  URINE OUTPUT:  150 mL.  DISPOSITION:  The patient was taken to recovery room in good condition.  VN/NUANCE  D:05/07/2020 T:05/07/2020 JOB:[id number]

## 2020-05-07 NOTE — Brief Op Note (Signed)
05/07/2020  2:22 PM  PATIENT:  Sheila Valentine  39 y.o. female  PRE-OPERATIVE DIAGNOSIS:  menorrhagia, fibroids, anemia Pelvic pain  POST-OPERATIVE DIAGNOSIS:  menorrhagia, fibroids, anemia Pelvic pain PROCEDURE:  Procedure(s): HYSTERECTOMY ABDOMINAL WITH SALPINGECTOMY (Bilateral)  SURGEON:  Surgeon(s) and Role:    * Paulmichael Schreck, Gwen Her, MD - Primary    * Benjaman Kindler, MD - Assisting  PHYSICIAN ASSISTANT: Alric Quan , PA student   ASSISTANTS: none   ANESTHESIA:   general  EBL:  50 mL iof 800 cc OU 150 cc  BLOOD ADMINISTERED:none  DRAINS: Urinary Catheter (Foley)   LOCAL MEDICATIONS USED:  MARCAINE    and BUPIVICAINE   SPECIMEN:  Source of Specimen:  cervix and uterus and bilateral fallopian tubes   DISPOSITION OF SPECIMEN:  PATHOLOGY  COUNTS:  YES  TOURNIQUET:  * No tourniquets in log *  DICTATION: .Other Dictation: Dictation Number verbal  PLAN OF CARE: admit to in patient  PATIENT DISPOSITION:  PACU - hemodynamically stable.   Delay start of Pharmacological VTE agent (>24hrs) due to surgical blood loss or risk of bleeding: yes

## 2020-05-07 NOTE — Transfer of Care (Signed)
Immediate Anesthesia Transfer of Care Note  Patient: Sheila Valentine  Procedure(s) Performed: HYSTERECTOMY ABDOMINAL WITH SALPINGECTOMY (Bilateral )  Patient Location: PACU  Anesthesia Type:General  Level of Consciousness: drowsy  Airway & Oxygen Therapy: Patient Spontanous Breathing and Patient connected to face mask  Post-op Assessment: Report given to RN and Post -op Vital signs reviewed and stable  Post vital signs: Reviewed and stable  Last Vitals:  Vitals Value Taken Time  BP 135/79 05/07/20 1439  Temp 36.8 C 05/07/20 1438  Pulse 66 05/07/20 1442  Resp 18 05/07/20 1442  SpO2 100 % 05/07/20 1442  Vitals shown include unvalidated device data.  Last Pain:  Vitals:   05/07/20 0945  TempSrc: Oral  PainSc: 8          Complications: No complications documented.

## 2020-05-07 NOTE — Anesthesia Preprocedure Evaluation (Signed)
Anesthesia Evaluation  Patient identified by MRN, date of birth, ID band Patient awake    Reviewed: Allergy & Precautions, NPO status , Patient's Chart, lab work & pertinent test results  Airway Mallampati: II  TM Distance: >3 FB     Dental   Pulmonary asthma , Current Smoker and Patient abstained from smoking.,    Pulmonary exam normal        Cardiovascular hypertension, Normal cardiovascular exam     Neuro/Psych negative neurological ROS  negative psych ROS   GI/Hepatic negative GI ROS, Neg liver ROS,   Endo/Other  negative endocrine ROS  Renal/GU negative Renal ROS  Female GU complaint     Musculoskeletal negative musculoskeletal ROS (+)   Abdominal Normal abdominal exam  (+)   Peds negative pediatric ROS (+)  Hematology  (+) anemia ,   Anesthesia Other Findings Past Medical History: No date: Anemia No date: Asthma No date: Hypertension     Comment:  no meds  Reproductive/Obstetrics                             Anesthesia Physical Anesthesia Plan  ASA: II  Anesthesia Plan: General   Post-op Pain Management:    Induction: Intravenous  PONV Risk Score and Plan:   Airway Management Planned: Oral ETT  Additional Equipment:   Intra-op Plan:   Post-operative Plan: Extubation in OR  Informed Consent: I have reviewed the patients History and Physical, chart, labs and discussed the procedure including the risks, benefits and alternatives for the proposed anesthesia with the patient or authorized representative who has indicated his/her understanding and acceptance.     Dental advisory given  Plan Discussed with: CRNA and Surgeon  Anesthesia Plan Comments:         Anesthesia Quick Evaluation

## 2020-05-08 ENCOUNTER — Encounter: Payer: Self-pay | Admitting: Obstetrics and Gynecology

## 2020-05-08 LAB — CBC
HCT: 32.1 % — ABNORMAL LOW (ref 36.0–46.0)
Hemoglobin: 9.9 g/dL — ABNORMAL LOW (ref 12.0–15.0)
MCH: 24.9 pg — ABNORMAL LOW (ref 26.0–34.0)
MCHC: 30.8 g/dL (ref 30.0–36.0)
MCV: 80.9 fL (ref 80.0–100.0)
Platelets: 285 10*3/uL (ref 150–400)
RBC: 3.97 MIL/uL (ref 3.87–5.11)
RDW: 19.6 % — ABNORMAL HIGH (ref 11.5–15.5)
WBC: 7.4 10*3/uL (ref 4.0–10.5)
nRBC: 0 % (ref 0.0–0.2)

## 2020-05-08 LAB — BASIC METABOLIC PANEL
Anion gap: 6 (ref 5–15)
BUN: 9 mg/dL (ref 6–20)
CO2: 26 mmol/L (ref 22–32)
Calcium: 8.3 mg/dL — ABNORMAL LOW (ref 8.9–10.3)
Chloride: 103 mmol/L (ref 98–111)
Creatinine, Ser: 0.75 mg/dL (ref 0.44–1.00)
GFR calc Af Amer: >60 mL/min (ref >60)
GFR calc non Af Amer: >60 mL/min (ref >60)
Glucose, Bld: 92 mg/dL (ref 70–99)
Potassium: 3.7 mmol/L (ref 3.5–5.1)
Sodium: 135 mmol/L (ref 135–145)

## 2020-05-08 LAB — NOVEL CORONAVIRUS, NAA (HOSP ORDER, SEND-OUT TO REF LAB; TAT 18-24 HRS): SARS-CoV-2, NAA: NOT DETECTED

## 2020-05-08 MED ORDER — MORPHINE SULFATE 2 MG/ML IV SOLN
INTRAVENOUS | Status: DC
  Administered 2020-05-08: 3.71 mg via INTRAVENOUS
  Administered 2020-05-08: 0.32 mg via INTRAVENOUS
  Administered 2020-05-08: 3.32 mg via INTRAVENOUS
  Filled 2020-05-08: qty 30

## 2020-05-08 MED ORDER — METRONIDAZOLE 500 MG PO TABS
2000.0000 mg | ORAL_TABLET | Freq: Once | ORAL | Status: AC
  Administered 2020-05-08: 2000 mg via ORAL
  Filled 2020-05-08: qty 4

## 2020-05-08 MED ORDER — MORPHINE SULFATE 2 MG/ML IV SOLN
INTRAVENOUS | Status: DC
Start: 2020-05-08 — End: 2020-05-08

## 2020-05-08 MED ORDER — DIPHENHYDRAMINE HCL 25 MG PO CAPS: 25.0000 mg | ORAL_CAPSULE | Freq: Once | ORAL | Status: AC

## 2020-05-08 MED ORDER — OXYCODONE HCL 5 MG PO TABS
10.0000 mg | ORAL_TABLET | ORAL | Status: DC | PRN
  Administered 2020-05-08 – 2020-05-10 (×11): 10 mg via ORAL
  Filled 2020-05-08 (×11): qty 2

## 2020-05-08 MED ORDER — DIPHENHYDRAMINE HCL 25 MG PO CAPS
25.0000 mg | ORAL_CAPSULE | Freq: Once | ORAL | Status: AC
  Administered 2020-05-08: 25 mg via ORAL
  Filled 2020-05-08: qty 1

## 2020-05-08 NOTE — Progress Notes (Signed)
Subjective:pod #1 from TAH , bilateral salpingectomy  PCA with good control of pain . On Flagyl for T vaginalis infection .  Patient reports + itching   Objective: I have reviewed patient's vital signs, intake and output and medications.  General: alert and cooperative Resp: slight  Cardio: decrease BS bilateral bases GI: soft, non-tender; bowel sounds normal; no masses,  no organomegaly and hypoactive BS  Results for orders placed or performed during the hospital encounter of 05/07/20 (from the past 24 hour(s))  Pregnancy, urine POC     Status: None   Collection Time: 05/07/20  9:43 AM  Result Value Ref Range   Preg Test, Ur NEGATIVE NEGATIVE  I-STAT, chem 8     Status: None   Collection Time: 05/07/20 10:09 AM  Result Value Ref Range   Sodium 140 135 - 145 mmol/L   Potassium 3.6 3.5 - 5.1 mmol/L   Chloride 105 98 - 111 mmol/L   BUN 12 6 - 20 mg/dL   Creatinine, Ser 0.80 0.44 - 1.00 mg/dL   Glucose, Bld 91 70 - 99 mg/dL   Calcium, Ion 1.17 1.15 - 1.40 mmol/L   TCO2 22 22 - 32 mmol/L   Hemoglobin 13.3 12.0 - 15.0 g/dL   HCT 39.0 36 - 46 %  SARS Coronavirus 2 by RT PCR (hospital order, performed in [place] hospital lab) Nasopharyngeal Nasopharyngeal Swab     Status: None   Collection Time: 05/07/20 10:26 AM   Specimen: Nasopharyngeal Swab  Result Value Ref Range   SARS Coronavirus 2 NEGATIVE NEGATIVE  CBC     Status: Abnormal   Collection Time: 05/07/20  5:09 PM  Result Value Ref Range   WBC 14.4 (H) 4.0 - 10.5 K/uL   RBC 4.87 3.87 - 5.11 MIL/uL   Hemoglobin 12.3 12.0 - 15.0 g/dL   HCT 39.1 36 - 46 %   MCV 80.3 80.0 - 100.0 fL   MCH 25.3 (L) 26.0 - 34.0 pg   MCHC 31.5 30.0 - 36.0 g/dL   RDW 20.0 (H) 11.5 - 15.5 %   Platelets 315 150 - 400 K/uL   nRBC 0.0 0.0 - 0.2 %  Creatinine, serum     Status: None   Collection Time: 05/07/20  5:09 PM  Result Value Ref Range   Creatinine, Ser 0.87 0.44 - 1.00 mg/dL   GFR calc non Af Amer >60 >60 mL/min   GFR calc Af Amer >60  >60 mL/min  CBC     Status: Abnormal   Collection Time: 05/08/20  6:20 AM  Result Value Ref Range   WBC 7.4 4.0 - 10.5 K/uL   RBC 3.97 3.87 - 5.11 MIL/uL   Hemoglobin 9.9 (L) 12.0 - 15.0 g/dL   HCT 32.1 (L) 36 - 46 %   MCV 80.9 80.0 - 100.0 fL   MCH 24.9 (L) 26.0 - 34.0 pg   MCHC 30.8 30.0 - 36.0 g/dL   RDW 19.6 (H) 11.5 - 15.5 %   Platelets 285 150 - 400 K/uL   nRBC 0.0 0.0 - 0.2 %  Basic metabolic panel     Status: Abnormal   Collection Time: 05/08/20  6:20 AM  Result Value Ref Range   Sodium 135 135 - 145 mmol/L   Potassium 3.7 3.5 - 5.1 mmol/L   Chloride 103 98 - 111 mmol/L   CO2 26 22 - 32 mmol/L   Glucose, Bld 92 70 - 99 mg/dL   BUN 9 6 - 20  mg/dL   Creatinine, Ser 0.75 0.44 - 1.00 mg/dL   Calcium 8.3 (L) 8.9 - 10.3 mg/dL   GFR calc non Af Amer >60 >60 mL/min   GFR calc Af Amer >60 >60 mL/min   Anion gap 6 5 - 15    Assessment/Plan: Stable  Advance diet , D/C PCA , D/c Foley , d/c IV  Flagyl 2 gm po today , premedicate with benadryl .   LOS: 1 day    Gwen Her Mishaal [place] 05/08/2020, 8:38 AM

## 2020-05-08 NOTE — Progress Notes (Signed)
Patient ID: Sheila Valentine, female   DOB: 10/20/1980, 39 y.o.   MRN: [id number] Doing better this afternoon . Tolerating reg diet . Po meds adequate   Continue care

## 2020-05-08 NOTE — Progress Notes (Signed)
0300- Pt noted to have RR of 10bpm and resting in bed. Pt is arousable. I notified pt of RR and moved her PCA button further from her (per nursing judgment) and told her I would be back to reassess. 0330- Pt RR is improved at 16 and pt is sleeping and easily aroused. 04:15- Pt calls and c/o SOB when sleeping. She says "it scared me and woke me up. It's like I couldn't catch my breath".  VSS and RR is better at 20bpm. Lung sounds are clear to auscultation and O2 sats WDL on room air. Pt states she feels better now that she is awake.  04:35- Pt reassessed. RR is 16 and she is sleepy but arousable. States that her breathing is short again. 38- On-call MD (beasley) notified of pt's c/o SOB and RR. Received orders to decrease basal dose on morphine PCA.  0455- Dose decreased and PCA pain button returned to pt.

## 2020-05-08 NOTE — Anesthesia Postprocedure Evaluation (Signed)
Anesthesia Post Note  Patient: Sheila Valentine  Procedure(s) Performed: HYSTERECTOMY ABDOMINAL WITH SALPINGECTOMY (Bilateral )  Patient location during evaluation: PACU Anesthesia Type: General Level of consciousness: awake and alert and oriented Pain management: pain level controlled Vital Signs Assessment: post-procedure vital signs reviewed and stable Respiratory status: spontaneous breathing Cardiovascular status: blood pressure returned to baseline Anesthetic complications: no   No complications documented.   Last Vitals:  Vitals:   05/08/20 1127 05/08/20 1526  BP: 123/68 135/85  Pulse: 74 72  Resp: 15 16  Temp: 36.8 C 37.1 C  SpO2:      Last Pain:  Vitals:   05/08/20 1600  TempSrc:   PainSc: 8                  Harald Quevedo

## 2020-05-09 NOTE — Progress Notes (Signed)
2 Days Post-Op Procedure(s) (LRB): HYSTERECTOMY ABDOMINAL WITH SALPINGECTOMY (Bilateral) POD#2 Subjective: Pain improving See received the 2 gm flagyl dose yesterday Objective: I have reviewed patient's vital signs, intake and output and medications.  General: alert and cooperative Resp: clear to auscultation bilaterally Cardio: regular rate and rhythm, S1, S2 normal, no murmur, click, rub or gallop GI: soft, non-tender; bowel sounds normal; no masses,  no organomegaly, incision C/D/I  Assessment: s/p Procedure(s): HYSTERECTOMY ABDOMINAL WITH SALPINGECTOMY (Bilateral): stable  Plan: Encourage ambulation  Shower  Anticipate d/c in am   LOS: 2 days    Sheila Valentine 05/09/2020, 7:40 AM

## 2020-05-09 NOTE — Progress Notes (Signed)
Ch ran into Pt while rounding the Mother baby area. Pt walking around the hallway hugging a pillow to her stomach. Ch asked her about what has been going on. She said that she was ready to go home, and was just walking around. Ch asked if she is being discharged, and Pt said that she has not been yet. She said that she was fine.

## 2020-05-10 MED ORDER — IBUPROFEN 800 MG PO TABS: 800.0000 mg | ORAL_TABLET | Freq: Three times a day (TID) | ORAL | 0 refills | Status: DC | PRN

## 2020-05-10 MED ORDER — ONDANSETRON HCL 8 MG PO TABS: 8.0000 mg | ORAL_TABLET | Freq: Three times a day (TID) | ORAL | 0 refills | Status: DC | PRN

## 2020-05-10 MED ORDER — GABAPENTIN 300 MG PO CAPS: 300.0000 mg | ORAL_CAPSULE | Freq: Every day | ORAL | 0 refills | Status: DC

## 2020-05-10 MED ORDER — OXYCODONE-ACETAMINOPHEN 5-325 MG PO TABS: 1.0000 | ORAL_TABLET | Freq: Four times a day (QID) | ORAL | 0 refills | Status: AC | PRN

## 2020-05-10 NOTE — Discharge Summary (Signed)
Physician Discharge Summary  Patient ID: Sheila Valentine MRN: [id number] DOB/AGE: 38-Mar-1982 38 y.o.  Admit date: 05/07/2020 Discharge date: 05/10/2020  Admission Diagnoses:symptomatic fibroid uterus  Pelvic pain  Fibroid uterus  Trichomonas vaginalis infection  Discharge Diagnoses:  Active Problems:   Menorrhagia   Post-operative state   Discharged Condition: good  Hospital Course: pt underwent an uncomplicated TAH , bilateral salpingectomy . Post operatively did well . Tolerating po food DOS and ambulating POD#1 . Pt was treated with IV Flagyl and po flagyl despite her allergy to med ( PO benadryl before) Incision healing well   Consults: None  Significant Diagnostic Studies: labs:  Results for orders placed or performed during the hospital encounter of 05/07/20 (from the past 72 hour(s))  SARS Coronavirus 2 by RT PCR (hospital order, performed in [facility] hospital lab) Nasopharyngeal Nasopharyngeal Swab     Status: None   Collection Time: 05/07/20 10:26 AM   Specimen: Nasopharyngeal Swab  Result Value Ref Range   SARS Coronavirus 2 NEGATIVE NEGATIVE    Comment: (NOTE) SARS-CoV-2 target nucleic acids are NOT DETECTED.  The SARS-CoV-2 RNA is generally detectable in upper and lower respiratory specimens during the acute phase of infection. The lowest concentration of SARS-CoV-2 viral copies this assay can detect is 250 copies / mL. A negative result does not preclude SARS-CoV-2 infection and should not be used as the sole basis for treatment or other patient management decisions.  A negative result may occur with improper specimen collection / handling, submission of specimen other than nasopharyngeal swab, presence of viral mutation(s) within the areas targeted by this assay, and inadequate number of viral copies (<250 copies / mL). A negative result must be combined with clinical observations, patient history, and epidemiological information.  Fact Sheet for Patients:    StrictlyIdeas.no  Fact Sheet for Healthcare Providers: BankingDealers.co.za  This test is not yet approved or  cleared by the [place] FDA and has been authorized for detection and/or diagnosis of SARS-CoV-2 by FDA under an Emergency Use Authorization (EUA).  This EUA will remain in effect (meaning this test can be used) for the duration of the COVID-19 declaration under Section 564(b)(1) of the Act, 21 U.S.C. section 360bbb-3(b)(1), unless the authorization is terminated or revoked sooner.  Performed at [facility], [address] [place], [place] [address]   CBC     Status: Abnormal   Collection Time: 05/07/20  5:09 PM  Result Value Ref Range   WBC 14.4 (H) 4.0 - 10.5 K/uL   RBC 4.87 3.87 - 5.11 MIL/uL   Hemoglobin 12.3 12.0 - 15.0 g/dL   HCT 39.1 36 - 46 %   MCV 80.3 80.0 - 100.0 fL   MCH 25.3 (L) 26.0 - 34.0 pg   MCHC 31.5 30.0 - 36.0 g/dL   RDW 20.0 (H) 11.5 - 15.5 %   Platelets 315 150 - 400 K/uL   nRBC 0.0 0.0 - 0.2 %    Comment: Performed at [facility], [address] [place], [place] [address]  Creatinine, serum     Status: None   Collection Time: 05/07/20  5:09 PM  Result Value Ref Range   Creatinine, Ser 0.87 0.44 - 1.00 mg/dL   GFR calc non Af Amer >60 >60 mL/min   GFR calc Af Amer >60 >60 mL/min    Comment: Performed at [facility], [address] [place], [place] [address]  CBC     Status: Abnormal   Collection Time: 05/08/20  6:20 AM  Result Value Ref Range   WBC 7.4 4.0 - 10.5 K/uL   RBC 3.97 3.87 - 5.11 MIL/uL   Hemoglobin 9.9 (L) 12.0 - 15.0 g/dL   HCT 32.1 (L) 36 - 46 %   MCV 80.9 80.0 - 100.0 fL   MCH 24.9 (L) 26.0 - 34.0 pg   MCHC 30.8 30.0 - 36.0 g/dL   RDW 19.6 (H) 11.5 - 15.5 %   Platelets 285 150 - 400 K/uL   nRBC 0.0 0.0 - 0.2 %    Comment: Performed at [facility], [address], [place], [place] [address]  Basic metabolic panel      Status: Abnormal   Collection Time: 05/08/20  6:20 AM  Result Value Ref Range   Sodium 135 135 - 145 mmol/L   Potassium 3.7 3.5 - 5.1 mmol/L   Chloride 103 98 - 111 mmol/L   CO2 26 22 - 32 mmol/L   Glucose, Bld 92 70 - 99 mg/dL    Comment: Glucose reference range applies only to samples taken after fasting for at least 8 hours.   BUN 9 6 - 20 mg/dL   Creatinine, Ser 0.75 0.44 - 1.00 mg/dL   Calcium 8.3 (L) 8.9 - 10.3 mg/dL   GFR calc non Af Amer >60 >60 mL/min   GFR calc Af Amer >60 >60 mL/min   Anion gap 6 5 - 15    Comment: Performed at [facility], [address] [place], [place] [address]   Treatments: surgery: as above  Discharge Exam: Blood pressure (!) 158/85, pulse 68, temperature 98.1 F (36.7 C), temperature source Oral, resp. rate 18, height 5\' 2"  (1.575 m), weight 80.3 kg, SpO2 100 %. General appearance: alert and cooperative Resp: clear to auscultation bilaterally Cardio: regular rate and rhythm, S1, S2 normal, no murmur, click, rub or gallop GI: soft, non-tender; bowel sounds normal; no masses,  no organomegaly Incision C/D/I  Disposition:  There are no questions and answers to display.         Allergies as of 05/10/2020      Reactions   Amoxicillin Hives   Metronidazole Hives   Penicillins Rash   rash      Medication List    TAKE these medications   albuterol 108 (90 Base) MCG/ACT inhaler Commonly known as: VENTOLIN HFA Inhale 2 puffs into the lungs every 4 (four) hours as needed.   ferrous sulfate 325 (65 FE) MG tablet Take 1 tablet (325 mg total) by mouth 2 (two) times daily with a meal.   gabapentin 300 MG capsule Commonly known as: Neurontin Take 1 capsule (300 mg total) by mouth at bedtime for 10 days.   ibuprofen 800 MG tablet Commonly known as: ADVIL Take 1 tablet (800 mg total) by mouth every 8 (eight) hours as needed.   ondansetron 8 MG tablet Commonly known as: Zofran Take 1 tablet (8 mg total) by mouth every 8  (eight) hours as needed for nausea or vomiting.   oxyCODONE-acetaminophen 5-325 MG tablet Commonly known as: Percocet Take 1-2 tablets by mouth every 6 (six) hours as needed for severe pain.       Follow-up Information    Thai Burgueno, Gwen Her, MD Follow up in 2 week(s).   Specialty: Obstetrics and Gynecology Why: post op  Contact information: [address] [place] [address] [phone]               Signed: Gwen Her  Maytal Mijangos 05/10/2020, 10:10 AM

## 2020-05-10 NOTE — Progress Notes (Signed)
Patient discharged home. Discharge instructions, prescriptions and follow up appointment given to and reviewed with patient. Patient verbalized understanding. Pt wheeled out by auxiliary.  

## 2020-05-12 LAB — SURGICAL PATHOLOGY

## 2020-12-11 ENCOUNTER — Emergency Department
Admission: EM | Admit: 2020-12-11 | Discharge: 2020-12-11 | Disposition: A | Discharge status: Home / Self Care | Payer: 59 | Attending: Emergency Medicine | Admitting: Emergency Medicine

## 2020-12-11 ENCOUNTER — Other Ambulatory Visit: Payer: Self-pay

## 2020-12-11 DIAGNOSIS — F17290 Nicotine dependence, other tobacco product, uncomplicated: Secondary | ICD-10-CM | POA: Insufficient documentation

## 2020-12-11 DIAGNOSIS — I10 Essential (primary) hypertension: Secondary | ICD-10-CM | POA: Diagnosis not present

## 2020-12-11 DIAGNOSIS — M5441 Lumbago with sciatica, right side: Secondary | ICD-10-CM | POA: Diagnosis not present

## 2020-12-11 DIAGNOSIS — G629 Polyneuropathy, unspecified: Secondary | ICD-10-CM | POA: Insufficient documentation

## 2020-12-11 DIAGNOSIS — J45909 Unspecified asthma, uncomplicated: Secondary | ICD-10-CM | POA: Diagnosis not present

## 2020-12-11 DIAGNOSIS — M79604 Pain in right leg: Secondary | ICD-10-CM | POA: Diagnosis not present

## 2020-12-11 DIAGNOSIS — M549 Dorsalgia, unspecified: Secondary | ICD-10-CM | POA: Diagnosis present

## 2020-12-11 DIAGNOSIS — G8929 Other chronic pain: Secondary | ICD-10-CM

## 2020-12-11 MED ORDER — DEXAMETHASONE SODIUM PHOSPHATE 10 MG/ML IJ SOLN
10.0000 mg | Freq: Once | INTRAMUSCULAR | Status: AC
  Administered 2020-12-11: 10 mg via INTRAMUSCULAR
  Filled 2020-12-11: qty 1

## 2020-12-11 MED ORDER — HYDROCODONE-ACETAMINOPHEN 5-325 MG PO TABS: 1.0000 | ORAL_TABLET | Freq: Four times a day (QID) | ORAL | 0 refills | Status: AC | PRN

## 2020-12-11 MED ORDER — NAPROXEN 375 MG PO TABS: 375.0000 mg | ORAL_TABLET | Freq: Two times a day (BID) | ORAL | 0 refills | Status: AC

## 2020-12-11 MED ORDER — KETOROLAC TROMETHAMINE 60 MG/2ML IM SOLN
60.0000 mg | Freq: Once | INTRAMUSCULAR | Status: AC
  Administered 2020-12-11: 60 mg via INTRAMUSCULAR
  Filled 2020-12-11: qty 2
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
## 2020-12-12 LAB — CBG MONITORING, ED: Glucose-Capillary: 97 mg/dL (ref 70–99)

## 2020-12-17 ENCOUNTER — Other Ambulatory Visit: Payer: Self-pay | Admitting: Physical Medicine & Rehabilitation

## 2020-12-17 ENCOUNTER — Other Ambulatory Visit: Payer: Self-pay

## 2020-12-17 ENCOUNTER — Ambulatory Visit
Admission: RE | Admit: 2020-12-17 | Discharge: 2020-12-17 | Disposition: A | Discharge status: Home / Self Care | Payer: 59 | Source: Ambulatory Visit | Attending: Physical Medicine & Rehabilitation | Admitting: Physical Medicine & Rehabilitation

## 2020-12-17 DIAGNOSIS — M5441 Lumbago with sciatica, right side: Secondary | ICD-10-CM

## 2020-12-17 DIAGNOSIS — G8929 Other chronic pain: Secondary | ICD-10-CM | POA: Diagnosis not present

## 2020-12-17 DIAGNOSIS — M545 Low back pain, unspecified: Secondary | ICD-10-CM | POA: Diagnosis not present

## 2020-12-18 DIAGNOSIS — M5441 Lumbago with sciatica, right side: Secondary | ICD-10-CM | POA: Diagnosis not present

## 2020-12-18 DIAGNOSIS — M48061 Spinal stenosis, lumbar region without neurogenic claudication: Secondary | ICD-10-CM | POA: Diagnosis not present

## 2020-12-18 DIAGNOSIS — G8929 Other chronic pain: Secondary | ICD-10-CM | POA: Diagnosis not present

## 2020-12-22 DIAGNOSIS — M5441 Lumbago with sciatica, right side: Secondary | ICD-10-CM | POA: Diagnosis not present

## 2020-12-22 DIAGNOSIS — M48061 Spinal stenosis, lumbar region without neurogenic claudication: Secondary | ICD-10-CM | POA: Diagnosis not present

## 2020-12-22 DIAGNOSIS — G8929 Other chronic pain: Secondary | ICD-10-CM | POA: Diagnosis not present

## 2021-01-08 DIAGNOSIS — M48061 Spinal stenosis, lumbar region without neurogenic claudication: Secondary | ICD-10-CM | POA: Diagnosis not present

## 2021-01-08 DIAGNOSIS — M47816 Spondylosis without myelopathy or radiculopathy, lumbar region: Secondary | ICD-10-CM | POA: Diagnosis not present

## 2021-01-08 DIAGNOSIS — M5441 Lumbago with sciatica, right side: Secondary | ICD-10-CM | POA: Diagnosis not present

## 2021-01-08 DIAGNOSIS — G8929 Other chronic pain: Secondary | ICD-10-CM | POA: Diagnosis not present

## 2021-01-15 DIAGNOSIS — M47816 Spondylosis without myelopathy or radiculopathy, lumbar region: Secondary | ICD-10-CM | POA: Diagnosis not present

## 2021-01-27 ENCOUNTER — Emergency Department: Payer: 59

## 2021-01-27 ENCOUNTER — Other Ambulatory Visit: Payer: Self-pay

## 2021-01-27 ENCOUNTER — Emergency Department
Admission: EM | Admit: 2021-01-27 | Discharge: 2021-01-27 | Disposition: A | Discharge status: Home / Self Care | Payer: 59 | Attending: Emergency Medicine | Admitting: Emergency Medicine

## 2021-01-27 DIAGNOSIS — M545 Low back pain, unspecified: Secondary | ICD-10-CM | POA: Insufficient documentation

## 2021-01-27 DIAGNOSIS — R2241 Localized swelling, mass and lump, right lower limb: Secondary | ICD-10-CM | POA: Diagnosis not present

## 2021-01-27 DIAGNOSIS — J45909 Unspecified asthma, uncomplicated: Secondary | ICD-10-CM | POA: Diagnosis not present

## 2021-01-27 DIAGNOSIS — M79661 Pain in right lower leg: Secondary | ICD-10-CM | POA: Diagnosis not present

## 2021-01-27 DIAGNOSIS — F17290 Nicotine dependence, other tobacco product, uncomplicated: Secondary | ICD-10-CM | POA: Diagnosis not present

## 2021-01-27 DIAGNOSIS — I10 Essential (primary) hypertension: Secondary | ICD-10-CM | POA: Insufficient documentation

## 2021-01-27 DIAGNOSIS — M25551 Pain in right hip: Secondary | ICD-10-CM | POA: Diagnosis not present

## 2021-01-27 DIAGNOSIS — S72301A Unspecified fracture of shaft of right femur, initial encounter for closed fracture: Secondary | ICD-10-CM | POA: Diagnosis not present

## 2021-01-27 DIAGNOSIS — G8929 Other chronic pain: Secondary | ICD-10-CM | POA: Insufficient documentation

## 2021-01-27 DIAGNOSIS — M79604 Pain in right leg: Secondary | ICD-10-CM | POA: Diagnosis not present

## 2021-01-27 DIAGNOSIS — M5126 Other intervertebral disc displacement, lumbar region: Secondary | ICD-10-CM | POA: Diagnosis not present

## 2021-01-27 DIAGNOSIS — M5127 Other intervertebral disc displacement, lumbosacral region: Secondary | ICD-10-CM | POA: Insufficient documentation

## 2021-01-27 DIAGNOSIS — M7989 Other specified soft tissue disorders: Secondary | ICD-10-CM | POA: Diagnosis not present

## 2021-01-27 DIAGNOSIS — R937 Abnormal findings on diagnostic imaging of other parts of musculoskeletal system: Secondary | ICD-10-CM | POA: Diagnosis not present

## 2021-01-27 LAB — CBC WITH DIFFERENTIAL/PLATELET
Abs Immature Granulocytes: 0.04 10*3/uL (ref 0.00–0.07)
Basophils Absolute: 0 10*3/uL (ref 0.0–0.1)
Basophils Relative: 1 %
Eosinophils Absolute: 0.1 10*3/uL (ref 0.0–0.5)
Eosinophils Relative: 1 %
HCT: 42.1 % (ref 36.0–46.0)
Hemoglobin: 14.3 g/dL (ref 12.0–15.0)
Immature Granulocytes: 1 %
Lymphocytes Relative: 19 %
Lymphs Abs: 1.4 10*3/uL (ref 0.7–4.0)
MCH: 31.8 pg (ref 26.0–34.0)
MCHC: 34 g/dL (ref 30.0–36.0)
MCV: 93.6 fL (ref 80.0–100.0)
Monocytes Absolute: 0.4 10*3/uL (ref 0.1–1.0)
Monocytes Relative: 6 %
Neutro Abs: 5.3 10*3/uL (ref 1.7–7.7)
Neutrophils Relative %: 72 %
Platelets: 290 10*3/uL (ref 150–400)
RBC: 4.5 MIL/uL (ref 3.87–5.11)
RDW: 13 % (ref 11.5–15.5)
WBC: 7.3 10*3/uL (ref 4.0–10.5)
nRBC: 0 % (ref 0.0–0.2)

## 2021-01-27 LAB — COMPREHENSIVE METABOLIC PANEL
ALT: 18 U/L (ref 0–44)
AST: 20 U/L (ref 15–41)
Albumin: 3.8 g/dL (ref 3.5–5.0)
Alkaline Phosphatase: 49 U/L (ref 38–126)
Anion gap: 5 (ref 5–15)
BUN: 14 mg/dL (ref 6–20)
CO2: 24 mmol/L (ref 22–32)
Calcium: 8.8 mg/dL — ABNORMAL LOW (ref 8.9–10.3)
Chloride: 105 mmol/L (ref 98–111)
Creatinine, Ser: 0.89 mg/dL (ref 0.44–1.00)
GFR, Estimated: >60 mL/min (ref >60)
Glucose, Bld: 116 mg/dL — ABNORMAL HIGH (ref 70–99)
Potassium: 3.6 mmol/L (ref 3.5–5.1)
Sodium: 134 mmol/L — ABNORMAL LOW (ref 135–145)
Total Bilirubin: 0.7 mg/dL (ref 0.3–1.2)
Total Protein: 7.4 g/dL (ref 6.5–8.1)

## 2021-01-27 LAB — BRAIN NATRIURETIC PEPTIDE: B Natriuretic Peptide: 51.2 pg/mL (ref 0.0–100.0)

## 2021-01-27 MED ORDER — ACETAMINOPHEN 500 MG PO TABS
1000.0000 mg | ORAL_TABLET | Freq: Once | ORAL | Status: AC
  Administered 2021-01-27: 1000 mg via ORAL
  Filled 2021-01-27: qty 2

## 2021-01-27 MED ORDER — DEXAMETHASONE SODIUM PHOSPHATE 10 MG/ML IJ SOLN
10.0000 mg | Freq: Once | INTRAMUSCULAR | Status: AC
  Administered 2021-01-27: 10 mg via INTRAVENOUS
  Filled 2021-01-27: qty 1

## 2021-01-27 MED ORDER — GADOBUTROL 1 MMOL/ML IV SOLN
6.0000 mL | Freq: Once | INTRAVENOUS | Status: AC | PRN
  Administered 2021-01-27: 6 mL via INTRAVENOUS

## 2021-01-27 MED ORDER — OXYCODONE HCL 5 MG PO TABS
5.0000 mg | ORAL_TABLET | Freq: Once | ORAL | Status: AC
  Administered 2021-01-27: 5 mg via ORAL
  Filled 2021-01-27: qty 1

## 2021-01-27 MED ORDER — KETOROLAC TROMETHAMINE 30 MG/ML IJ SOLN
15.0000 mg | Freq: Once | INTRAMUSCULAR | Status: AC
  Administered 2021-01-27: 15 mg via INTRAVENOUS
  Filled 2021-01-27: qty 1

## 2021-01-27 MED ORDER — PREDNISONE 20 MG PO TABS: 40.0000 mg | ORAL_TABLET | Freq: Every day | ORAL | 0 refills | Status: AC
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
## 2021-01-27 NOTE — ED Notes (Signed)
Patient returned from MRI.

## 2021-01-27 NOTE — ED Notes (Signed)
Patient currently in MRI.

## 2021-01-27 NOTE — ED Triage Notes (Signed)
Pt states she has been seeing a specialist at the spine clinic at [facility] for spinal edema and was doing better and recently had swelling return in her right leg, states she contacted them yesterday and was told there is nothing else they can do to help her, went to urgent care and was sent here.

## 2021-01-27 NOTE — ED Notes (Signed)
This RN to bedside, medication given as ordered, pt requesting a breakfast tray at this time, explained would check and see if [place] sandwich tray available for patient. Pt states understanding.

## 2021-01-27 NOTE — ED Notes (Signed)
Pt transported to US at this time. 

## 2021-01-27 NOTE — ED Notes (Signed)
MRI screening in progress
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
## 2021-01-28 DIAGNOSIS — M79604 Pain in right leg: Secondary | ICD-10-CM | POA: Diagnosis not present

## 2021-01-28 DIAGNOSIS — F321 Major depressive disorder, single episode, moderate: Secondary | ICD-10-CM | POA: Diagnosis not present

## 2021-01-30 DIAGNOSIS — M47816 Spondylosis without myelopathy or radiculopathy, lumbar region: Secondary | ICD-10-CM | POA: Diagnosis not present

## 2021-01-30 DIAGNOSIS — M5441 Lumbago with sciatica, right side: Secondary | ICD-10-CM | POA: Diagnosis not present

## 2021-01-30 DIAGNOSIS — M25551 Pain in right hip: Secondary | ICD-10-CM | POA: Diagnosis not present

## 2021-01-30 DIAGNOSIS — M48061 Spinal stenosis, lumbar region without neurogenic claudication: Secondary | ICD-10-CM | POA: Diagnosis not present

## 2021-02-09 DIAGNOSIS — M25551 Pain in right hip: Secondary | ICD-10-CM | POA: Diagnosis not present

## 2021-02-11 DIAGNOSIS — M5441 Lumbago with sciatica, right side: Secondary | ICD-10-CM | POA: Diagnosis not present

## 2021-02-11 DIAGNOSIS — F321 Major depressive disorder, single episode, moderate: Secondary | ICD-10-CM | POA: Diagnosis not present

## 2021-02-11 DIAGNOSIS — M5416 Radiculopathy, lumbar region: Secondary | ICD-10-CM | POA: Diagnosis not present

## 2021-02-11 DIAGNOSIS — M7989 Other specified soft tissue disorders: Secondary | ICD-10-CM | POA: Diagnosis not present

## 2021-02-19 DIAGNOSIS — M48061 Spinal stenosis, lumbar region without neurogenic claudication: Secondary | ICD-10-CM | POA: Diagnosis not present

## 2021-02-19 DIAGNOSIS — G8929 Other chronic pain: Secondary | ICD-10-CM | POA: Diagnosis not present

## 2021-02-19 DIAGNOSIS — M25551 Pain in right hip: Secondary | ICD-10-CM | POA: Diagnosis not present

## 2021-02-19 DIAGNOSIS — M79604 Pain in right leg: Secondary | ICD-10-CM | POA: Diagnosis not present

## 2021-02-19 DIAGNOSIS — M5416 Radiculopathy, lumbar region: Secondary | ICD-10-CM | POA: Diagnosis not present

## 2021-02-19 DIAGNOSIS — M5441 Lumbago with sciatica, right side: Secondary | ICD-10-CM | POA: Diagnosis not present

## 2021-03-09 DIAGNOSIS — M5441 Lumbago with sciatica, right side: Secondary | ICD-10-CM | POA: Diagnosis not present

## 2021-03-09 DIAGNOSIS — G8929 Other chronic pain: Secondary | ICD-10-CM | POA: Diagnosis not present

## 2021-03-28 DIAGNOSIS — M25552 Pain in left hip: Secondary | ICD-10-CM | POA: Diagnosis not present

## 2021-03-28 DIAGNOSIS — M7989 Other specified soft tissue disorders: Secondary | ICD-10-CM | POA: Diagnosis not present

## 2021-03-28 DIAGNOSIS — G8929 Other chronic pain: Secondary | ICD-10-CM | POA: Diagnosis not present

## 2021-03-28 DIAGNOSIS — M25551 Pain in right hip: Secondary | ICD-10-CM | POA: Diagnosis not present

## 2021-03-28 DIAGNOSIS — M5441 Lumbago with sciatica, right side: Secondary | ICD-10-CM | POA: Diagnosis not present

## 2021-03-28 DIAGNOSIS — M5416 Radiculopathy, lumbar region: Secondary | ICD-10-CM | POA: Diagnosis not present

## 2021-03-31 DIAGNOSIS — F1190 Opioid use, unspecified, uncomplicated: Secondary | ICD-10-CM | POA: Diagnosis not present

## 2021-03-31 DIAGNOSIS — G894 Chronic pain syndrome: Secondary | ICD-10-CM | POA: Diagnosis not present

## 2021-04-17 DIAGNOSIS — M5441 Lumbago with sciatica, right side: Secondary | ICD-10-CM | POA: Diagnosis not present

## 2021-04-17 DIAGNOSIS — R300 Dysuria: Secondary | ICD-10-CM | POA: Diagnosis not present

## 2021-04-17 DIAGNOSIS — M79604 Pain in right leg: Secondary | ICD-10-CM | POA: Diagnosis not present

## 2021-04-17 DIAGNOSIS — M5416 Radiculopathy, lumbar region: Secondary | ICD-10-CM | POA: Diagnosis not present

## 2021-04-28 DIAGNOSIS — Z5181 Encounter for therapeutic drug level monitoring: Secondary | ICD-10-CM | POA: Diagnosis not present

## 2021-04-28 DIAGNOSIS — M5417 Radiculopathy, lumbosacral region: Secondary | ICD-10-CM | POA: Diagnosis not present

## 2021-04-28 DIAGNOSIS — G894 Chronic pain syndrome: Secondary | ICD-10-CM | POA: Diagnosis not present

## 2021-04-28 DIAGNOSIS — Z79891 Long term (current) use of opiate analgesic: Secondary | ICD-10-CM | POA: Diagnosis not present

## 2021-05-19 DIAGNOSIS — M5417 Radiculopathy, lumbosacral region: Secondary | ICD-10-CM | POA: Diagnosis not present

## 2021-05-19 DIAGNOSIS — M47816 Spondylosis without myelopathy or radiculopathy, lumbar region: Secondary | ICD-10-CM | POA: Diagnosis not present

## 2021-05-19 DIAGNOSIS — Z5181 Encounter for therapeutic drug level monitoring: Secondary | ICD-10-CM | POA: Diagnosis not present

## 2021-05-19 DIAGNOSIS — G894 Chronic pain syndrome: Secondary | ICD-10-CM | POA: Diagnosis not present

## 2021-05-19 DIAGNOSIS — Z79891 Long term (current) use of opiate analgesic: Secondary | ICD-10-CM | POA: Diagnosis not present

## 2021-05-28 DIAGNOSIS — M5416 Radiculopathy, lumbar region: Secondary | ICD-10-CM | POA: Diagnosis not present

## 2021-05-28 DIAGNOSIS — R030 Elevated blood-pressure reading, without diagnosis of hypertension: Secondary | ICD-10-CM | POA: Diagnosis not present

## 2021-06-04 DIAGNOSIS — M5416 Radiculopathy, lumbar region: Secondary | ICD-10-CM | POA: Diagnosis not present

## 2021-06-04 DIAGNOSIS — M5441 Lumbago with sciatica, right side: Secondary | ICD-10-CM | POA: Diagnosis not present

## 2021-06-04 DIAGNOSIS — R030 Elevated blood-pressure reading, without diagnosis of hypertension: Secondary | ICD-10-CM | POA: Diagnosis not present

## 2021-06-04 DIAGNOSIS — G8929 Other chronic pain: Secondary | ICD-10-CM | POA: Diagnosis not present

## 2021-06-11 ENCOUNTER — Other Ambulatory Visit: Payer: Self-pay | Admitting: Obstetrics and Gynecology

## 2021-06-11 DIAGNOSIS — Z01419 Encounter for gynecological examination (general) (routine) without abnormal findings: Secondary | ICD-10-CM | POA: Diagnosis not present

## 2021-06-11 DIAGNOSIS — Z1331 Encounter for screening for depression: Secondary | ICD-10-CM | POA: Diagnosis not present

## 2021-06-11 DIAGNOSIS — Z1231 Encounter for screening mammogram for malignant neoplasm of breast: Secondary | ICD-10-CM

## 2021-06-11 DIAGNOSIS — N643 Galactorrhea not associated with childbirth: Secondary | ICD-10-CM | POA: Diagnosis not present

## 2021-06-18 DIAGNOSIS — G894 Chronic pain syndrome: Secondary | ICD-10-CM | POA: Diagnosis not present

## 2021-06-18 DIAGNOSIS — M5417 Radiculopathy, lumbosacral region: Secondary | ICD-10-CM | POA: Diagnosis not present

## 2021-06-18 DIAGNOSIS — Z5181 Encounter for therapeutic drug level monitoring: Secondary | ICD-10-CM | POA: Diagnosis not present

## 2021-06-18 DIAGNOSIS — F1190 Opioid use, unspecified, uncomplicated: Secondary | ICD-10-CM | POA: Diagnosis not present

## 2021-06-18 DIAGNOSIS — M47816 Spondylosis without myelopathy or radiculopathy, lumbar region: Secondary | ICD-10-CM | POA: Diagnosis not present

## 2021-06-18 DIAGNOSIS — Z79891 Long term (current) use of opiate analgesic: Secondary | ICD-10-CM | POA: Diagnosis not present

## 2021-07-23 DIAGNOSIS — G894 Chronic pain syndrome: Secondary | ICD-10-CM | POA: Diagnosis not present

## 2021-07-23 DIAGNOSIS — Z5181 Encounter for therapeutic drug level monitoring: Secondary | ICD-10-CM | POA: Diagnosis not present

## 2021-07-23 DIAGNOSIS — M5417 Radiculopathy, lumbosacral region: Secondary | ICD-10-CM | POA: Diagnosis not present

## 2021-07-23 DIAGNOSIS — M47816 Spondylosis without myelopathy or radiculopathy, lumbar region: Secondary | ICD-10-CM | POA: Diagnosis not present

## 2021-07-23 DIAGNOSIS — Z79891 Long term (current) use of opiate analgesic: Secondary | ICD-10-CM | POA: Diagnosis not present

## 2021-10-01 ENCOUNTER — Other Ambulatory Visit: Payer: Self-pay

## 2021-10-01 ENCOUNTER — Ambulatory Visit
Admission: RE | Admit: 2021-10-01 | Discharge: 2021-10-01 | Disposition: A | Discharge status: Home / Self Care | Payer: Medicaid Other | Source: Ambulatory Visit | Attending: Obstetrics and Gynecology | Admitting: Obstetrics and Gynecology

## 2021-10-01 DIAGNOSIS — Z1231 Encounter for screening mammogram for malignant neoplasm of breast: Secondary | ICD-10-CM | POA: Insufficient documentation

## 2021-10-02 ENCOUNTER — Other Ambulatory Visit: Payer: Self-pay | Admitting: Obstetrics and Gynecology

## 2021-10-02 DIAGNOSIS — N6489 Other specified disorders of breast: Secondary | ICD-10-CM

## 2021-10-02 DIAGNOSIS — R928 Other abnormal and inconclusive findings on diagnostic imaging of breast: Secondary | ICD-10-CM

## 2021-10-21 ENCOUNTER — Other Ambulatory Visit: Payer: Self-pay

## 2021-10-21 ENCOUNTER — Ambulatory Visit
Admission: RE | Admit: 2021-10-21 | Discharge: 2021-10-21 | Disposition: A | Discharge status: Home / Self Care | Payer: Medicaid Other | Source: Ambulatory Visit | Attending: Obstetrics and Gynecology | Admitting: Obstetrics and Gynecology

## 2021-10-21 DIAGNOSIS — R928 Other abnormal and inconclusive findings on diagnostic imaging of breast: Secondary | ICD-10-CM | POA: Insufficient documentation

## 2021-10-21 DIAGNOSIS — N6489 Other specified disorders of breast: Secondary | ICD-10-CM | POA: Insufficient documentation

## 2021-11-17 ENCOUNTER — Other Ambulatory Visit: Payer: Self-pay

## 2021-11-17 ENCOUNTER — Ambulatory Visit: Payer: Medicaid Other | Admitting: Family Medicine

## 2021-11-17 ENCOUNTER — Encounter: Payer: Self-pay | Admitting: Family Medicine

## 2021-11-17 DIAGNOSIS — Z113 Encounter for screening for infections with a predominantly sexual mode of transmission: Secondary | ICD-10-CM

## 2021-11-17 DIAGNOSIS — Z299 Encounter for prophylactic measures, unspecified: Secondary | ICD-10-CM

## 2021-11-17 DIAGNOSIS — Z202 Contact with and (suspected) exposure to infections with a predominantly sexual mode of transmission: Secondary | ICD-10-CM

## 2021-11-17 MED ORDER — GENTAMICIN SULFATE 40 MG/ML IJ SOLN
240.0000 mg | Freq: Once | INTRAMUSCULAR | Status: AC
  Administered 2021-11-17: 240 mg via INTRAMUSCULAR

## 2021-11-17 MED ORDER — CLOTRIMAZOLE 1 % VA CREA: 1.0000 | TOPICAL_CREAM | Freq: Every day | VAGINAL | 0 refills | Status: AC

## 2021-11-17 MED ORDER — AZITHROMYCIN 500 MG PO TABS
2000.0000 mg | ORAL_TABLET | Freq: Once | ORAL | Status: AC
  Administered 2021-11-17: 2000 mg via ORAL

## 2021-11-17 NOTE — Progress Notes (Addendum)
Pt here as a contact to Gonorrhea.  Medication given and dispensed per Provider orders.   Gentamicin 240 mg given IM without any complications.  Windle Guard, RN ? ?

## 2021-11-17 NOTE — Progress Notes (Signed)
[facility] Department ? ?STI clinic/screening visit ?[place] [address] ?[phone] ? ?Subjective:  ?Sheila Valentine is a 41 y.o. female being seen today for an STI screening visit. The patient reports they do have symptoms.  Patient reports that they do not desire a pregnancy in the next year.   They reported they are not interested in discussing contraception today.   ? ?No LMP recorded. Patient has had a hysterectomy. ? ? ?Patient has the following medical conditions:   ?Patient Active Problem List  ? Diagnosis Date Noted  ? Post-operative state 05/07/2020  ? Menorrhagia 05/01/2020  ? ? ?Chief Complaint  ?Patient presents with  ? SEXUALLY TRANSMITTED DISEASE  ?  Screening  ? ? ?HPI ? ?Patient reports here for screening, reports contact to gonorrhea  ? ?Last HIV test per patient/review of record was 03*/10/2021 ?Patient reports last pap was "a couple of years ago". ? ?Screening for MPX risk: ?Does the patient have an unexplained rash? No ?Is the patient MSM? No ?Does the patient endorse multiple sex partners or anonymous sex partners? Yes ?Did the patient have close or sexual contact with a person diagnosed with MPX? No ?Has the patient traveled outside the [place] where MPX is endemic? No ?Is there a high clinical suspicion for MPX-- evidenced by one of the following No ? -Unlikely to be chickenpox ? -Lymphadenopathy ? -Rash that present in same phase of evolution on any given body part ?See flowsheet for further details and programmatic requirements.  ? ? ?The following portions of the patient's history were reviewed and updated as appropriate: allergies, current medications, past medical history, past social history, past surgical history and problem list. ? ?Objective:  ?There were no vitals filed for this visit. ? ?Physical Exam ?Vitals and nursing note reviewed.  ?Constitutional:   ?   Appearance: Normal appearance.  ?HENT:  ?   Head: Normocephalic and atraumatic.  ?    Mouth/Throat:  ?   Mouth: Mucous membranes are moist.  ?   Pharynx: Oropharynx is clear. No oropharyngeal exudate or posterior oropharyngeal erythema.  ?Pulmonary:  ?   Effort: Pulmonary effort is normal.  ?Abdominal:  ?   General: Abdomen is flat.  ?   Palpations: There is no mass.  ?   Tenderness: There is no abdominal tenderness. There is no rebound.  ?Genitourinary: ?   Exam position: Lithotomy position.  ?   Pubic Area: No rash or pubic lice.   ?   Labia:     ?   Right: No rash or lesion.     ?   Left: No rash or lesion.   ?   Vagina: No erythema, bleeding or lesions.  ?   Cervix: No cervical motion tenderness, discharge, friability, lesion or erythema.  ?   Uterus: Normal.   ?   Adnexa: Right adnexa normal and left adnexa normal.  ?   Comments: Deferred- no testing d/t recent abt usage.  ?Musculoskeletal:  ?   Cervical back: Normal range of motion.  ?Lymphadenopathy:  ?   Head:  ?   Right side of head: No preauricular or posterior auricular adenopathy.  ?   Left side of head: No preauricular or posterior auricular adenopathy.  ?   Cervical: No cervical adenopathy.  ?   Upper Body:  ?   Right upper body: No supraclavicular or axillary adenopathy.  ?   Left upper body: No supraclavicular or axillary adenopathy.  ?  Lower Body: No right inguinal adenopathy. No left inguinal adenopathy.  ?Skin: ?   General: Skin is warm and dry.  ?   Findings: No rash.  ?Neurological:  ?   Mental Status: She is alert and oriented to person, place, and time.  ?Psychiatric:     ?   Behavior: Behavior normal.  ? ? ? ?Assessment and Plan:  ?Sheila Valentine is a 41 y.o. female presenting to the [facility] Department for STI screening ? ?1. Screening examination for venereal disease ?Patient accepted all screenings including oral, vaginal CT/GC and declined bloodwork for HIV/RPR.   ? ?Patient meets criteria for HepB screening? No. Ordered? No - does not meet criteria  ?Patient meets criteria for HepC screening? No. Ordered? No  - does not meet criteria  ?Patient not screened d/t recent antibiotic treatment, last dose of clindamycin taken on 11/12/2021 for Bacterial infection.  ?Pt treated today.  ?Treatment needed for gonorrhea  ?Discussed time line for State Lab results and that patient will be called with positive results and encouraged patient to call if she had not heard in 2 weeks.  ?Counseled to return or seek care for continued or worsening symptoms ?Recommended condom use with all sex ? ?Patient is currently using  BTL   to prevent pregnancy.  ? ?2. Exposure to gonorrhea ?Pt informed by partner + for GC  ?Treated today  ?Treated with RN, see note.   ?Pt tolerated well, monitored for 15 mins.  ? ?- gentamicin (GARAMYCIN) injection 240 mg ?- azithromycin (ZITHROMAX) tablet 2,000 mg ? ?3. Prophylactic measure ?Treated for yeast d/t recent and continual need for antibiotic need.   ?- clotrimazole (V-R CLOTRIMAZOLE VAGINAL) 1 % vaginal cream; Place 1 Applicatorful vaginally at bedtime for 7 days.  Dispense: 45 g; Refill: 0 ? ? ? ? ?No follow-ups on file. ? ?No future appointments. ? ?Junious Dresser, FNP ?

## 2021-12-23 ENCOUNTER — Other Ambulatory Visit: Payer: Self-pay | Admitting: Family Medicine

## 2021-12-23 DIAGNOSIS — Z1231 Encounter for screening mammogram for malignant neoplasm of breast: Secondary | ICD-10-CM

## 2022-11-24 IMAGING — MR MR LUMBAR SPINE W/O CM
5 series · 30 of 48 positions shown · non-contrast
Comparison: No pertinent prior exams available for comparison.

CLINICAL DATA: Acute low back pain with right-sided sciatica,
unspecified back pain laterality. Additional history provided:
intense episodes over the past few days; pain going down legs with
numbness and tingling.

EXAM:
MRI LUMBAR SPINE WITHOUT CONTRAST
TECHNIQUE: Multiplanar, multisequence MR imaging of the lumbar spine was
performed. No intravenous contrast was administered.

[Series 5: T2 · sagittal · 4.0mm · 0.81mm/px · 6 of 17 slices shown (1 of 2)]
[im 1/17]
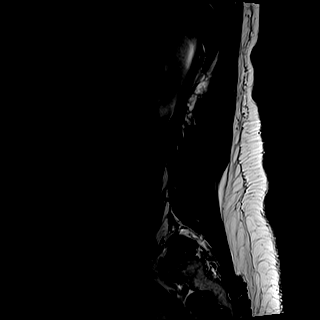
[im 4/17]
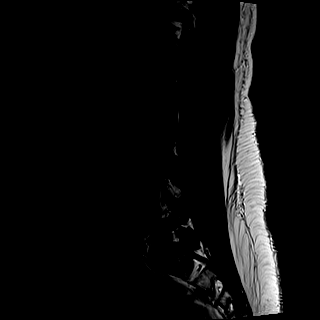
[im 7/17]
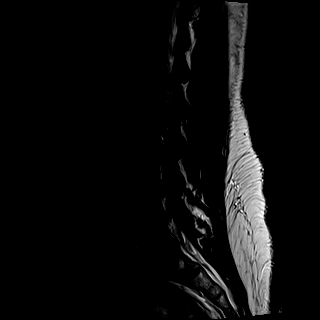
[im 10/17]
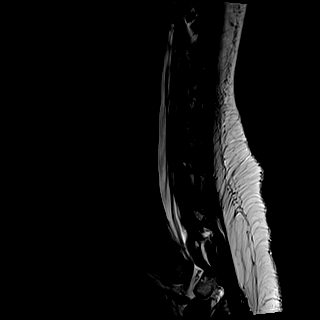
[im 13/17]
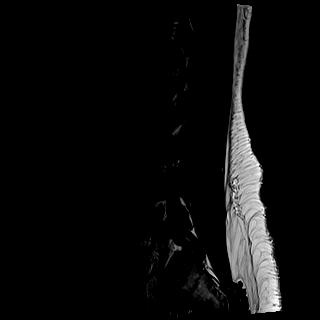
[im 17/17]
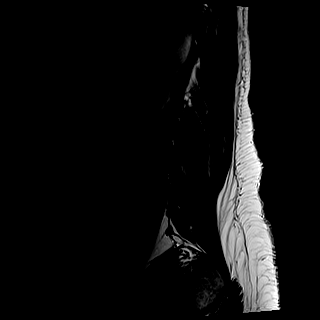

[Series 6: T1 · sagittal · 4.0mm · 0.81mm/px · 7 of 17 slices shown (1 of 2)]
[im 1/17]
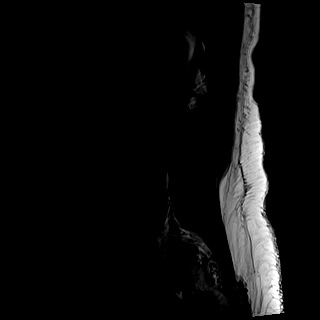
[im 3/17]
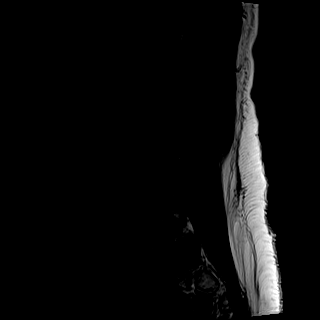
[im 6/17]
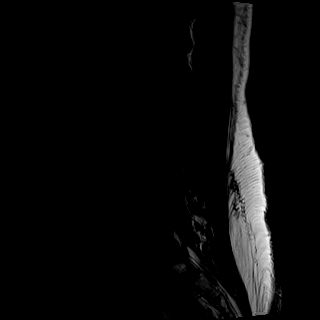
[im 9/17]
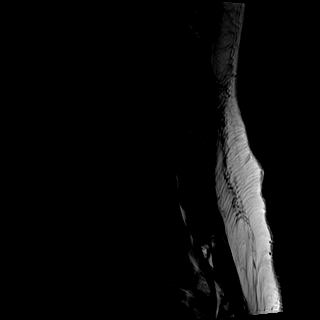
[im 11/17]
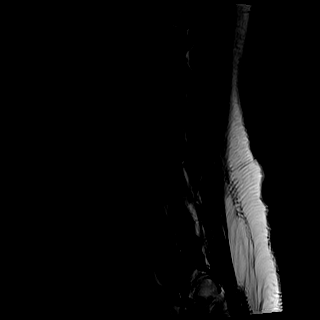
[im 14/17]
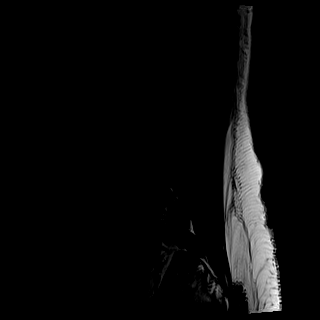
[im 17/17]
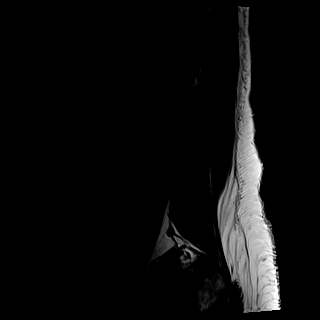

[Series 7: STIR · sagittal · 4.0mm · 0.41mm/px · 1 of 17 slices shown]
[im 1/17]
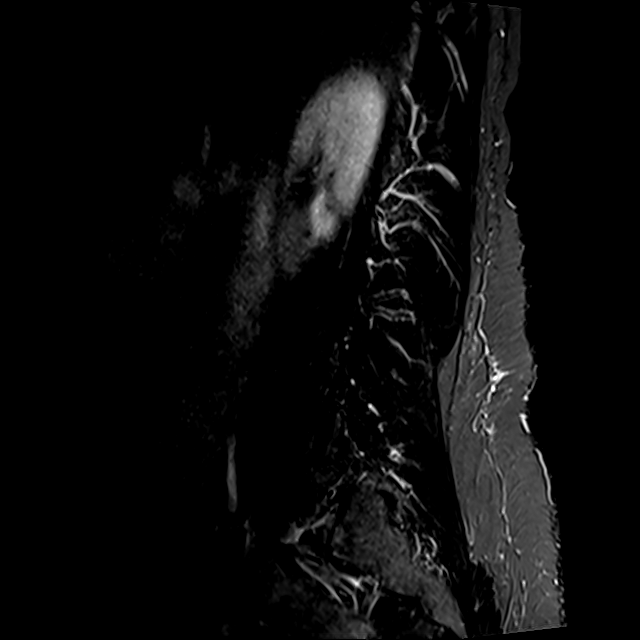

[Series 8: T2 · axial · 4.0mm · 0.78mm/px · z∈[-66,+128]mm · 8 of 36 slices shown (2 of 2)]
[im 1/36]
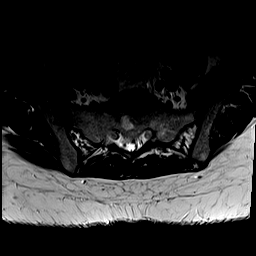
[im 6/36]
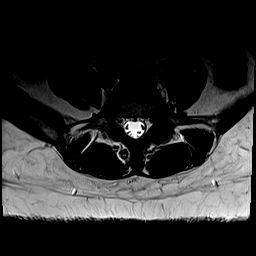
[im 11/36]
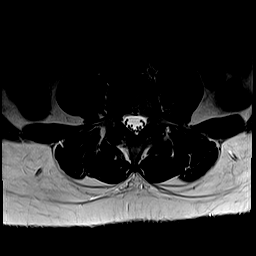
[im 17/36]
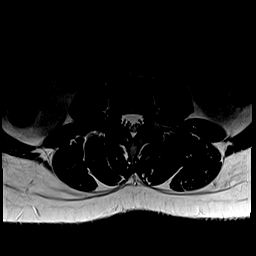
[im 19/36]
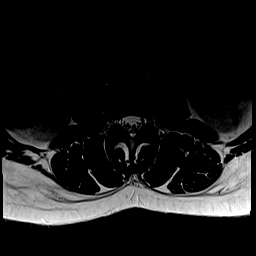
[im 25/36]
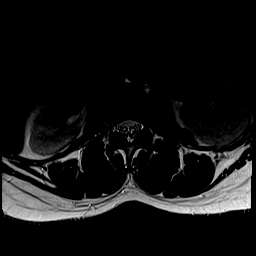
[im 30/36]
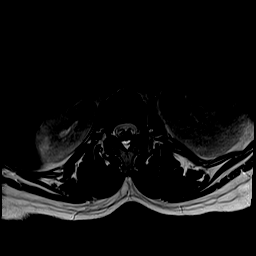
[im 36/36]
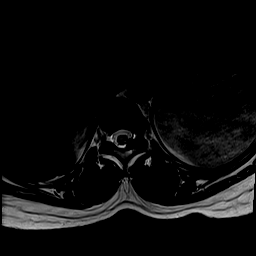

[Series 9: T1 · axial · 4.0mm · 0.39mm/px · z∈[-66,+128]mm · 8 of 36 slices shown (2 of 2)]
[im 1/36]
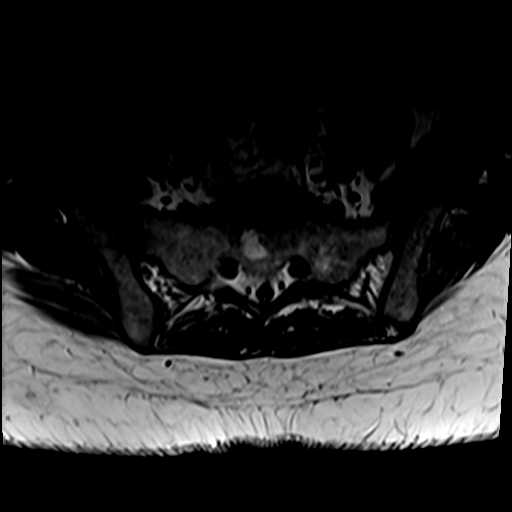
[im 6/36]
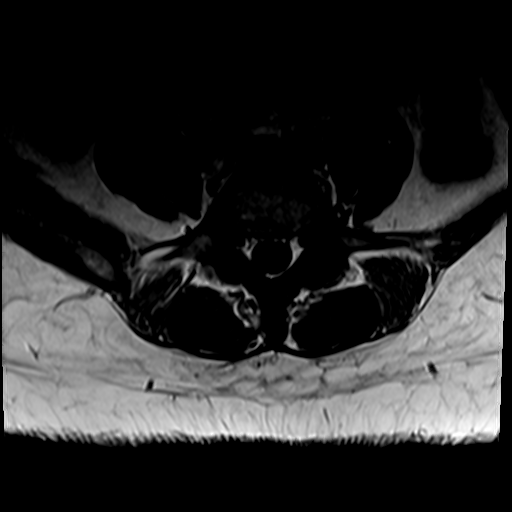
[im 11/36]
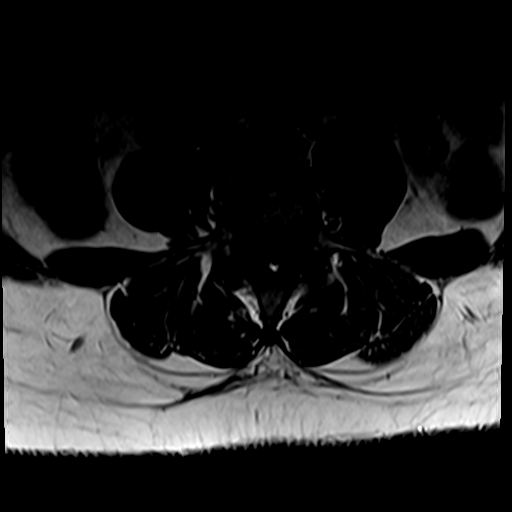
[im 17/36]
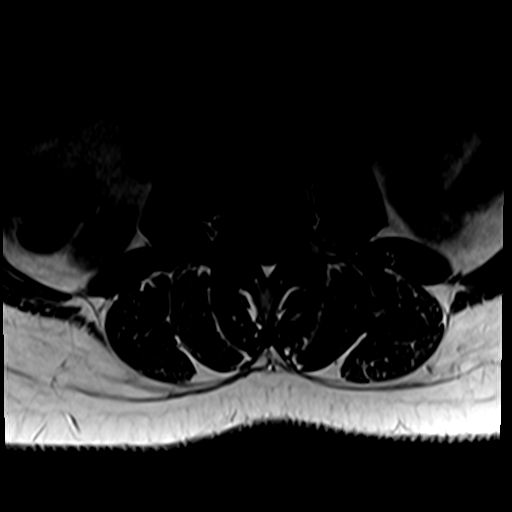
[im 19/36]
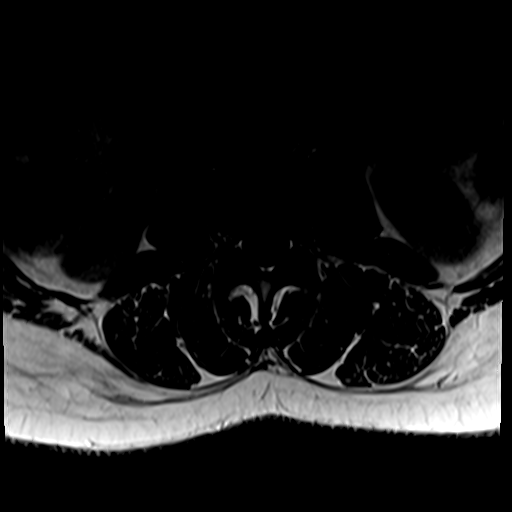
[im 25/36]
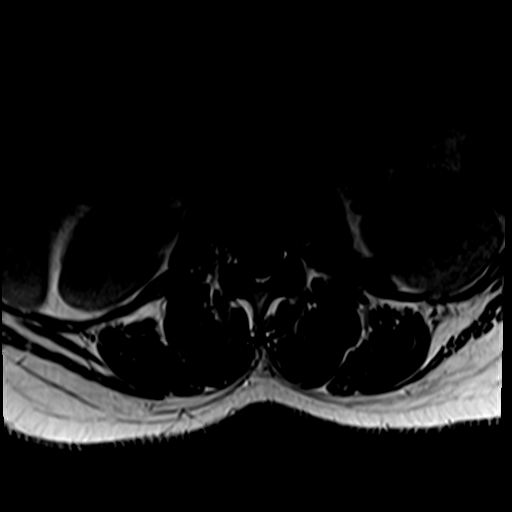
[im 30/36]
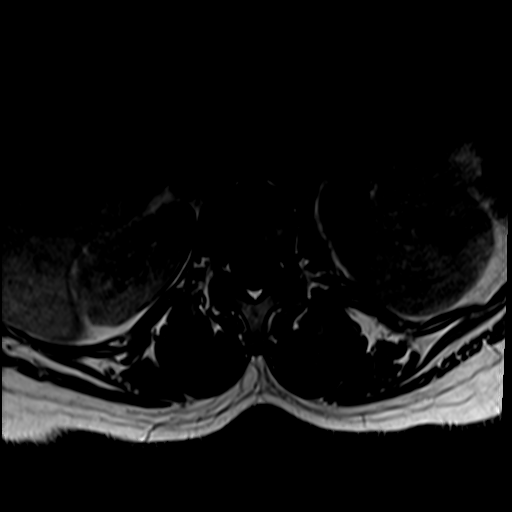
[im 36/36]
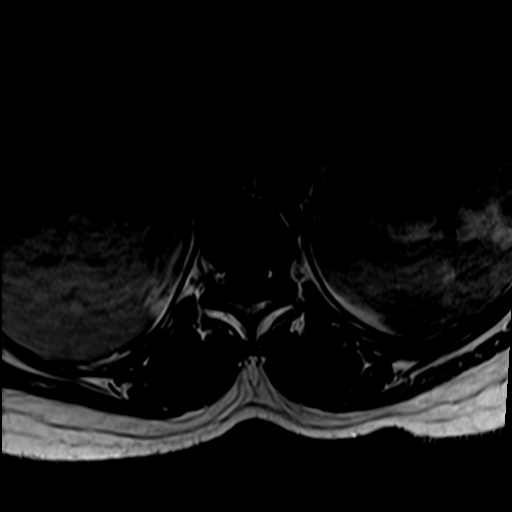

[30 of 48 positions shown; findings below may reference images not displayed]

FINDINGS: Moderate motion degradation of the sagittal T1 weighted sequence.

Segmentation: For the purposes of this dictation, five lumbar
vertebrae are assumed and the caudal most well-formed intervertebral
disc is designated L5-S1.

Alignment: Straightening of the expected lumbar lordosis. No
significant spondylolisthesis.

Vertebrae: Vertebral body height is maintained. Nonspecific 9 mm
round T1 and T2 hypointense, sclerotic appearing lesion within the
right posterior L2 vertebral body and right L2 pedicle (for instance
as seen on series 5, image 7) (series 8, image 14). Trace
degenerative edema along the L5 inferior endplate. Mild edema is
also present within the right L5 pedicle/articular pillar, which may
be degenerative or may reflect stress reaction. Hemangioma within
the S1 vertebra.

Conus medullaris and cauda equina: Conus extends to the L2 level. No
signal abnormality within the visualized distal spinal cord.

Paraspinal and other soft tissues: No abnormality identified within
included portions of the abdomen/retroperitoneum. Paraspinal soft
tissues within normal limits.

Disc levels:

Mild disc degeneration at L5-S1. Intervertebral disc height and
hydration are otherwise maintained.

T10-T11: Incompletely imaged in the sagittal plane. Small disc
bulge. Minimal effacement of the ventral thecal sac without
appreciable significant central canal stenosis. The neural foramina
are excluded from the field of view.

T11-T12: Imaged sagittally. Facet arthrosis/ligamentum flavum
hypertrophy. No significant disc herniation or stenosis.

T12-L1: No significant disc herniation or stenosis.

L1-L2: Mild facet arthrosis/ligamentum flavum hypertrophy. No
significant disc herniation or stenosis.

L2-L3: Mild facet arthrosis/ligamentum flavum hypertrophy. No
significant disc herniation or stenosis.

L3-L4: Mild facet arthrosis/ligamentum flavum hypertrophy. No
significant disc herniation or stenosis.

L4-L5: No significant disc herniation or spinal canal stenosis. Mild
facet arthrosis. Mild relative right neural foraminal narrowing.

L5-S1: Small disc bulge. Facet arthrosis (mild right, mild/moderate
left). Trace fluid within the bilateral facet joints. No significant
spinal canal stenosis. Mild relative bilateral neural foraminal
narrowing.
IMPRESSION: The sagittal T1 weighted sequence is moderately motion degraded.

Lumbar spondylosis as outlined and with findings most notably as
follows.

At L5-S1, there is mild disc degeneration. Small disc bulge. Facet
arthrosis (mild right, mild/moderate left). Trace fluid within the
bilateral facet joints. Mild relative bilateral neural foraminal
narrowing. No significant spinal canal stenosis.

At L4-L5, mild facet arthrosis results in mild relative right neural
foraminal narrowing.

Mild facet arthrosis and ligamentum flavum hypertrophy at additional
levels. However, there is no significant spinal canal or foraminal
stenosis at these remaining levels.

Mild degenerative endplate edema at L5-S1.

Mild edema is also present within the right L5 pedicle/articular
pillar, which may be degenerative or may reflect stress reaction.

Nonspecific 9 mm sclerotic lesion within the right posterior aspect
of the vertebral body and right pedicle at L2.

## 2023-01-06 ENCOUNTER — Emergency Department
Admission: EM | Admit: 2023-01-06 | Discharge: 2023-01-07 | Disposition: A | Discharge status: Home / Self Care | Payer: Medicaid Other | Attending: Student in an Organized Health Care Education/Training Program | Admitting: Student in an Organized Health Care Education/Training Program

## 2023-01-06 ENCOUNTER — Encounter: Payer: Self-pay | Admitting: *Deleted

## 2023-01-06 ENCOUNTER — Other Ambulatory Visit: Payer: Self-pay

## 2023-01-06 DIAGNOSIS — M545 Low back pain, unspecified: Secondary | ICD-10-CM | POA: Diagnosis present

## 2023-01-06 DIAGNOSIS — G8929 Other chronic pain: Secondary | ICD-10-CM | POA: Insufficient documentation
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
## 2023-01-06 NOTE — ED Triage Notes (Signed)
Pt has lower back pain.  Pt reports pain meds not working.  No known injury. Pt scheduled for  back surgery next week per pt.  No urinary sx.  Pt alert.

## 2023-01-07 ENCOUNTER — Other Ambulatory Visit: Payer: Self-pay | Admitting: Family Medicine

## 2023-01-07 DIAGNOSIS — Z1231 Encounter for screening mammogram for malignant neoplasm of breast: Secondary | ICD-10-CM

## 2023-01-07 MED ORDER — KETOROLAC TROMETHAMINE 30 MG/ML IJ SOLN
30.0000 mg | Freq: Once | INTRAMUSCULAR | Status: AC
  Administered 2023-01-07: 30 mg via INTRAMUSCULAR
  Filled 2023-01-07: qty 1

## 2023-01-07 MED ORDER — OXYCODONE-ACETAMINOPHEN 5-325 MG PO TABS
1.0000 | ORAL_TABLET | Freq: Once | ORAL | Status: AC
  Administered 2023-01-07: 1 via ORAL
  Filled 2023-01-07: qty 1

## 2023-01-07 NOTE — ED Provider Notes (Signed)
[facility] Provider Note    Event Date/Time   First MD Initiated Contact with Patient 01/06/23 2338     (approximate)   History   Back Pain   HPI  Sheila Valentine is a 42 y.o. female history of chronic low back pain presents to the ER for evaluation of pain in her low back.  Denies any urinary incontinence or numbness or tingling.  No fevers.  Primary concern is that her pain medication at home is not controlling it.  She is scheduled for outpatient pain clinic procedure person next week and has follow-up tomorrow.     Physical Exam   Triage Vital Signs: ED Triage Vitals  Enc Vitals Group     BP 01/06/23 2250 (!) 137/98     Pulse Rate 01/06/23 2250 (!) 105     Resp 01/06/23 2250 20     Temp 01/06/23 2250 98.6 F (37 C)     Temp Source 01/06/23 2250 Oral     SpO2 01/06/23 2250 96 %     Weight 01/06/23 2249 191 lb (86.6 kg)     Height 01/06/23 2249 5\' 2"  (1.575 m)     Head Circumference --      Peak Flow --      Pain Score 01/06/23 2249 10     Pain Loc --      Pain Edu? --      Excl. in GC? --     Most recent vital signs: Vitals:   01/06/23 2250  BP: (!) 137/98  Pulse: (!) 105  Resp: 20  Temp: 98.6 F (37 C)  SpO2: 96%     Constitutional: Alert  Eyes: Conjunctivae are normal.  Head: Atraumatic. Nose: No congestion/rhinnorhea. Mouth/Throat: Mucous membranes are moist.   Neck: Painless ROM.  Cardiovascular:   Good peripheral circulation. Respiratory: Normal respiratory effort.  No retractions.  Gastrointestinal: Soft and nontender.  Musculoskeletal:  no deformity Neurologic:  MAE spontaneously. No gross focal neurologic deficits are appreciated.  Skin:  Skin is warm, dry and intact. No rash noted. Psychiatric: Mood and affect are normal. Speech and behavior are normal.    ED Results / Procedures / Treatments   Labs (all labs ordered are listed, but only abnormal results are displayed) Labs Reviewed - No data to  display   EKG     RADIOLOGY    PROCEDURES:  Critical Care performed:   Procedures   MEDICATIONS ORDERED IN ED: Medications  ketorolac (TORADOL) 30 MG/ML injection 30 mg (has no administration in time range)  oxyCODONE-acetaminophen (PERCOCET/ROXICET) 5-325 MG per tablet 1 tablet (has no administration in time range)     IMPRESSION / MDM / ASSESSMENT AND PLAN / ED COURSE  I reviewed the triage vital signs and the nursing notes.                              Differential diagnosis includes, but is not limited to, radiculopathy, spinal stenosis, cauda equina, pyelonephritis, musculoskeletal strain, lumbago, fracture  Patient with well-documented history of chronic low back pain presents to the ER for evaluation of recurrent persistent low back pain not being controlled with pain medication.  She is able to ambulate with steady gait.  No acute neurologic symptoms to indicate need for additional imaging or diagnostic testing.  No urinary symptoms.  Will give Toradol as well as dose of oral Percocet.  She is already with pain clinic and  has follow-up tomorrow I do believe she is appropriate for outpatient follow-up.       FINAL CLINICAL IMPRESSION(S) / ED DIAGNOSES   Final diagnoses:  Chronic low back pain, unspecified back pain laterality, unspecified whether sciatica present     Rx / DC Orders   ED Discharge Orders     None        Note:  This document was prepared using Dragon voice recognition software and may include unintentional dictation errors.    Willy Eddy, MD 01/07/23 [phone]

## 2023-01-10 ENCOUNTER — Other Ambulatory Visit: Payer: Self-pay | Admitting: Family Medicine

## 2023-01-10 DIAGNOSIS — G8929 Other chronic pain: Secondary | ICD-10-CM

## 2023-01-10 DIAGNOSIS — M5416 Radiculopathy, lumbar region: Secondary | ICD-10-CM

## 2023-01-21 ENCOUNTER — Ambulatory Visit
Admission: RE | Admit: 2023-01-21 | Discharge: 2023-01-21 | Disposition: A | Discharge status: Home / Self Care | Payer: Medicaid Other | Source: Ambulatory Visit | Attending: Family Medicine | Admitting: Family Medicine

## 2023-01-21 DIAGNOSIS — M5416 Radiculopathy, lumbar region: Secondary | ICD-10-CM | POA: Insufficient documentation

## 2023-01-21 DIAGNOSIS — M5441 Lumbago with sciatica, right side: Secondary | ICD-10-CM | POA: Diagnosis present

## 2023-01-21 DIAGNOSIS — G8929 Other chronic pain: Secondary | ICD-10-CM | POA: Insufficient documentation

## 2023-02-14 ENCOUNTER — Telehealth: Payer: Self-pay
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
## 2023-02-14 NOTE — Telephone Encounter (Signed)
Ms Sidener is scheduled to see Dr Myer Haff tomorrow. However, she was just seen by [facility] on 02/11/23. She is welcome to still come to her appointment with Dr Myer Haff tomorrow, but she is also welcome to continue her care with [facility] since she already saw them. Left message to return call to discuss.

## 2023-02-15 ENCOUNTER — Ambulatory Visit: Payer: Medicaid Other | Admitting: Neurosurgery

## 2023-02-15 NOTE — Telephone Encounter (Signed)
She called back before lunch and I scheduled her for 7/11. She says that [facility] is doing nothing for her because they don't care. So she would like to keep her care here local.

## 2023-02-20 ENCOUNTER — Emergency Department
Admission: EM | Admit: 2023-02-20 | Discharge: 2023-02-20 | Disposition: A | Discharge status: Home / Self Care | Payer: Medicaid Other | Attending: Emergency Medicine | Admitting: Emergency Medicine

## 2023-02-20 ENCOUNTER — Other Ambulatory Visit: Payer: Self-pay

## 2023-02-20 DIAGNOSIS — M545 Low back pain, unspecified: Secondary | ICD-10-CM | POA: Insufficient documentation

## 2023-02-20 MED ORDER — KETOROLAC TROMETHAMINE 30 MG/ML IJ SOLN
30.0000 mg | Freq: Once | INTRAMUSCULAR | Status: AC
  Administered 2023-02-20: 30 mg via INTRAMUSCULAR
  Filled 2023-02-20: qty 1

## 2023-02-20 MED ORDER — TRAMADOL HCL 50 MG PO TABS: 50.0000 mg | ORAL_TABLET | Freq: Four times a day (QID) | ORAL | 0 refills | Status: DC | PRN

## 2023-02-20 NOTE — ED Triage Notes (Addendum)
Pt to ed from the streets due to back pain. Pt has chronic pain and is followed by the pain clinic in [place]. Pt states "I just had a back surgery on 6/7" but per her EMR she has not had a recent surgery. Pt care is all at [facility] and now she is homeless. Pt is caox4, in no acute distress and ambulatory in triage. Pt has all of her belongings with her.

## 2023-02-20 NOTE — ED Provider Notes (Addendum)
[facility] Provider Note    Event Date/Time   First MD Initiated Contact with Patient 02/20/23 2309     (approximate)   History   Back Pain (chronic)   HPI  Sheila Valentine is a 42 y.o. female who presents with complaints of back pain.  She reports she had surgery at [facility] on June 7 and has had continuous pain since then.  She is being aggressive with nursing staff and appears quite frustrated despite having been in the room for only a few minutes.  No complaints of neurodeficits, no fevers.  She states that she is here only for pain control and that she has pain management appointment in July     Physical Exam   Triage Vital Signs: ED Triage Vitals  Enc Vitals Group     BP 02/20/23 2203 (!) 144/103     Pulse Rate 02/20/23 2203 100     Resp 02/20/23 2203 16     Temp 02/20/23 2203 98 F (36.7 C)     Temp Source 02/20/23 2203 Oral     SpO2 02/20/23 2203 98 %     Weight 02/20/23 2204 86 kg (189 lb 9.5 oz)     Height 02/20/23 2204 1.524 m (5')     Head Circumference --      Peak Flow --      Pain Score 02/20/23 2203 10     Pain Loc --      Pain Edu? --      Excl. in GC? --     Most recent vital signs: Vitals:   02/20/23 2203  BP: (!) 144/103  Pulse: 100  Resp: 16  Temp: 98 F (36.7 C)  SpO2: 98%     General: Awake, no distress.  CV:  Good peripheral perfusion.  Resp:  Normal effort.  Abd:  No distention.  Other:  Ambulating well without difficulty   ED Results / Procedures / Treatments   Labs (all labs ordered are listed, but only abnormal results are displayed) Labs Reviewed - No data to display   EKG     RADIOLOGY     PROCEDURES:  Critical Care performed:   Procedures   MEDICATIONS ORDERED IN ED: Medications  ketorolac (TORADOL) 30 MG/ML injection 30 mg (has no administration in time range)     IMPRESSION / MDM / ASSESSMENT AND PLAN / ED COURSE  I reviewed the triage vital signs and the nursing  notes. Patient's presentation is most consistent with exacerbation of chronic illness.  Patient with acute on chronic back pain.  Aggressive behavior and being rude to staff.    Will give a dose of IM Toradol here, prescription for tramadol to hold her over until her pain management appointment.  This is a chronic condition, no neurodeficits or red flag symptoms.  Appropriate for discharge at this time.  While nurse was discharging the patient she started cursing saying that "[facility]".  This is in keeping with her aggressive behavior during her visit.  Appropriate care was provided, threatening behavior towards staff not tolerated in our emergency department      FINAL CLINICAL IMPRESSION(S) / ED DIAGNOSES   Final diagnoses:  Acute low back pain without sciatica, unspecified back pain laterality     Rx / DC Orders   ED Discharge Orders          Ordered    traMADol (ULTRAM) 50 MG  tablet  Every 6 hours PRN        02/20/23 2309             Note:  This document was prepared using Dragon voice recognition software and may include unintentional dictation errors.   Jene Every, MD 02/20/23 1610    Jene Every, MD 02/20/23 [phone]

## 2023-03-01 ENCOUNTER — Ambulatory Visit: Payer: Medicaid Other

## 2023-03-01 ENCOUNTER — Ambulatory Visit (INDEPENDENT_AMBULATORY_CARE_PROVIDER_SITE_OTHER): Payer: Medicaid Other | Admitting: Neurosurgery

## 2023-03-01 ENCOUNTER — Encounter: Payer: Self-pay | Admitting: Neurosurgery

## 2023-03-01 ENCOUNTER — Ambulatory Visit
Admission: RE | Admit: 2023-03-01 | Discharge: 2023-03-01 | Disposition: A | Discharge status: Home / Self Care | Payer: Medicaid Other | Source: Ambulatory Visit | Attending: Family Medicine | Admitting: Family Medicine

## 2023-03-01 VITALS — BP 136/84 | Ht 60.0 in | Wt 189.0 lb

## 2023-03-01 DIAGNOSIS — G8929 Other chronic pain: Secondary | ICD-10-CM

## 2023-03-01 DIAGNOSIS — G894 Chronic pain syndrome: Secondary | ICD-10-CM | POA: Diagnosis not present

## 2023-03-01 DIAGNOSIS — M5441 Lumbago with sciatica, right side: Secondary | ICD-10-CM | POA: Diagnosis not present

## 2023-03-01 DIAGNOSIS — Z1231 Encounter for screening mammogram for malignant neoplasm of breast: Secondary | ICD-10-CM | POA: Diagnosis present
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
## 2023-03-03 ENCOUNTER — Ambulatory Visit: Payer: Medicaid Other | Admitting: Neurosurgery

## 2023-03-23 ENCOUNTER — Other Ambulatory Visit: Payer: Self-pay

## 2023-03-23 DIAGNOSIS — J4520 Mild intermittent asthma, uncomplicated: Secondary | ICD-10-CM | POA: Insufficient documentation

## 2023-03-23 DIAGNOSIS — R059 Cough, unspecified: Secondary | ICD-10-CM | POA: Diagnosis present

## 2023-03-23 DIAGNOSIS — U071 COVID-19: Secondary | ICD-10-CM | POA: Insufficient documentation

## 2023-03-23 LAB — RESP PANEL BY RT-PCR (RSV, FLU A&B, COVID)  RVPGX2
Influenza A by PCR: NEGATIVE
Influenza B by PCR: NEGATIVE
Resp Syncytial Virus by PCR: NEGATIVE
SARS Coronavirus 2 by RT PCR: POSITIVE — AB

## 2023-03-23 NOTE — ED Triage Notes (Signed)
Pt presents to ER with c/o covid/flu like symptoms that started around 0300 today.  Pt states she woke up, and noticed the bed sheets were wet from her sweat.  Pt reports she has felt bad all day today.  Reports feeling chills, HA, congestion, diarrhea, and body aches.  Unknown if pt has been around anyone sick.  Pt denies having any fever at home.  Pt is otherwise A&O x4 and in NAD.

## 2023-03-24 ENCOUNTER — Emergency Department
Admission: EM | Admit: 2023-03-24 | Discharge: 2023-03-24 | Disposition: A | Discharge status: Home / Self Care | Payer: Medicaid Other | Attending: Emergency Medicine | Admitting: Emergency Medicine

## 2023-03-24 ENCOUNTER — Emergency Department: Payer: Medicaid Other

## 2023-03-24 DIAGNOSIS — U071 COVID-19: Secondary | ICD-10-CM

## 2023-03-24 DIAGNOSIS — J4520 Mild intermittent asthma, uncomplicated: Secondary | ICD-10-CM

## 2023-03-24 MED ORDER — NIRMATRELVIR/RITONAVIR (PAXLOVID)TABLET: 3.0000 | ORAL_TABLET | Freq: Two times a day (BID) | ORAL | 0 refills | Status: AC

## 2023-03-24 MED ORDER — PREDNISONE 20 MG PO TABS: 40.0000 mg | ORAL_TABLET | Freq: Every day | ORAL | 0 refills | Status: AC

## 2023-03-24 MED ORDER — IBUPROFEN 600 MG PO TABS
600.0000 mg | ORAL_TABLET | Freq: Once | ORAL | Status: AC
  Administered 2023-03-24: 600 mg via ORAL
  Filled 2023-03-24: qty 1

## 2023-03-24 MED ORDER — PREDNISONE 20 MG PO TABS
60.0000 mg | ORAL_TABLET | Freq: Once | ORAL | Status: AC
  Administered 2023-03-24: 60 mg via ORAL
  Filled 2023-03-24: qty 3

## 2023-03-24 MED ORDER — ALBUTEROL SULFATE HFA 108 (90 BASE) MCG/ACT IN AERS: 2.0000 | INHALATION_SPRAY | RESPIRATORY_TRACT | 1 refills | Status: AC | PRN

## 2023-03-24 MED ORDER — ACETAMINOPHEN 500 MG PO TABS
1000.0000 mg | ORAL_TABLET | Freq: Once | ORAL | Status: AC
  Administered 2023-03-24: 1000 mg via ORAL
  Filled 2023-03-24: qty 2

## 2023-03-24 MED ORDER — HYDROCOD POLI-CHLORPHE POLI ER 10-8 MG/5ML PO SUER: 5.0000 mL | Freq: Two times a day (BID) | ORAL | 0 refills | Status: AC | PRN

## 2023-03-24 MED ORDER — IBUPROFEN 600 MG PO TABS: 600.0000 mg | ORAL_TABLET | Freq: Three times a day (TID) | ORAL | 0 refills | Status: DC | PRN

## 2023-03-24 NOTE — Discharge Instructions (Signed)
Take Tylenol 1000 mg every 4-6 hours and Ibuprofen every 6 hours for pain, fever. Try to alternate the two every few hours.  For your breathing, take the prednisone, paxlovid, and use your albuterol.   Drink plenty of fluids.   Eat more frequent, small meals.

## 2023-03-24 NOTE — ED Provider Notes (Signed)
[facility] Provider Note    Event Date/Time   First MD Initiated Contact with Patient 03/24/23 0019     (approximate)   History   Generalized Body Aches   HPI  Sheila Valentine is a 42 y.o. female  here with cough, generalized body aches. Began yesterday, fairly acutely. Diffuse body aches, fevers, and sweats. She's had poor appetite. She has had SOB with some wheezing, with h/o asthma/bronchitis and she did take her inhaler today. No known sick contacts. No h/o COVID but has not been vaccinated "in a few years."       Physical Exam   Triage Vital Signs: ED Triage Vitals [03/23/23 2220]  Encounter Vitals Group     BP (!) 170/90     Systolic BP Percentile      Diastolic BP Percentile      Pulse Rate (!) 107     Resp 20     Temp 98.6 F (37 C)     Temp Source Oral     SpO2 98 %     Weight 181 lb (82.1 kg)     Height 5\' 1"  (1.549 m)     Head Circumference      Peak Flow      Pain Score 8     Pain Loc      Pain Education      Exclude from Growth Chart     Most recent vital signs: Vitals:   03/23/23 2220  BP: (!) 170/90  Pulse: (!) 107  Resp: 20  Temp: 98.6 F (37 C)  SpO2: 98%     General: Awake, no distress.  CV:  Good peripheral perfusion. Mild tachycardia but regular. No murmurs. Resp:  Normal work of breathing though mild tachypnea. Speaking in full sentences. Scant expiratory wheezes. Abd:  No distention. No tenderness. Other:  MMM. Non-toxic.    ED Results / Procedures / Treatments   Labs (all labs ordered are listed, but only abnormal results are displayed) Labs Reviewed  RESP PANEL BY RT-PCR (RSV, FLU A&B, COVID)  [id number] - Abnormal; Notable for the following components:      Result Value   SARS Coronavirus 2 by RT PCR POSITIVE (*)    All other components within normal limits     EKG    RADIOLOGY CXR: No acute PNA   I also independently reviewed and agree with radiologist  interpretations.   PROCEDURES:  Critical Care performed: No   MEDICATIONS ORDERED IN ED: Medications  acetaminophen (TYLENOL) tablet 1,000 mg (1,000 mg Oral Given 03/24/23 0106)  ibuprofen (ADVIL) tablet 600 mg (600 mg Oral Given 03/24/23 0106)  predniSONE (DELTASONE) tablet 60 mg (60 mg Oral Given 03/24/23 0106)     IMPRESSION / MDM / ASSESSMENT AND PLAN / ED COURSE  I reviewed the triage vital signs and the nursing notes.                              Differential diagnosis includes, but is not limited to, COVID-19, bronchitis, PNA, asthma exacerbation  Patient's presentation is most consistent with acute presentation with potential threat to life or bodily function.  The patient is on the cardiac monitor to evaluate for evidence of arrhythmia and/or significant heart rate changes  42 yo F here with cough, wheezing, body aches. Suspect COVID-19 with mild underlying RAD. CXR is clear. COVID+. Sats >95% on RA and she has normal WOB. She  does have mild underlying lung dz, no vaccinations in >3 yr and no h/o COVID-19 so will give paxlovid. Otherwise, steroids and supportive care. Return precautions given.    FINAL CLINICAL IMPRESSION(S) / ED DIAGNOSES   Final diagnoses:  COVID-19  Mild intermittent reactive airway disease without complication     Rx / DC Orders   ED Discharge Orders          Ordered    predniSONE (DELTASONE) 20 MG tablet  Daily        03/24/23 0108    albuterol (VENTOLIN HFA) 108 (90 Base) MCG/ACT inhaler  Every 4 hours PRN        03/24/23 0108    ibuprofen (ADVIL) 600 MG tablet  Every 8 hours PRN        03/24/23 0108    nirmatrelvir/ritonavir (PAXLOVID) 20 x 150 MG & 10 x 100MG  TABS  2 times daily        03/24/23 0108    chlorpheniramine-HYDROcodone (TUSSIONEX) 10-8 MG/5ML  Every 12 hours PRN        03/24/23 0108             Note:  This document was prepared using Dragon voice recognition software and may include unintentional dictation errors.    Shaune Pollack, MD 03/24/23 [phone]
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
## 2023-05-03 DIAGNOSIS — Z79899 Other long term (current) drug therapy: Secondary | ICD-10-CM | POA: Insufficient documentation

## 2023-05-03 DIAGNOSIS — Z789 Other specified health status: Secondary | ICD-10-CM | POA: Insufficient documentation

## 2023-05-03 DIAGNOSIS — M899 Disorder of bone, unspecified: Secondary | ICD-10-CM | POA: Insufficient documentation

## 2023-05-03 DIAGNOSIS — G894 Chronic pain syndrome: Secondary | ICD-10-CM | POA: Insufficient documentation

## 2023-05-03 DIAGNOSIS — R937 Abnormal findings on diagnostic imaging of other parts of musculoskeletal system: Secondary | ICD-10-CM | POA: Insufficient documentation

## 2023-05-03 DIAGNOSIS — G8929 Other chronic pain: Secondary | ICD-10-CM | POA: Insufficient documentation

## 2023-05-03 NOTE — Patient Instructions (Signed)

## 2023-05-04 ENCOUNTER — Ambulatory Visit (HOSPITAL_BASED_OUTPATIENT_CLINIC_OR_DEPARTMENT_OTHER): Payer: Medicaid Other | Admitting: Pain Medicine

## 2023-05-04 DIAGNOSIS — Z91199 Patient's noncompliance with other medical treatment and regimen due to unspecified reason: Secondary | ICD-10-CM

## 2023-05-04 DIAGNOSIS — Z79899 Other long term (current) drug therapy: Secondary | ICD-10-CM

## 2023-05-04 DIAGNOSIS — Z789 Other specified health status: Secondary | ICD-10-CM

## 2023-05-04 DIAGNOSIS — G894 Chronic pain syndrome: Secondary | ICD-10-CM

## 2023-05-04 DIAGNOSIS — R937 Abnormal findings on diagnostic imaging of other parts of musculoskeletal system: Secondary | ICD-10-CM

## 2023-05-04 DIAGNOSIS — G8929 Other chronic pain: Secondary | ICD-10-CM

## 2023-05-04 DIAGNOSIS — M899 Disorder of bone, unspecified: Secondary | ICD-10-CM
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
## 2023-05-20 NOTE — Patient Instructions (Signed)

## 2023-05-25 ENCOUNTER — Ambulatory Visit (HOSPITAL_BASED_OUTPATIENT_CLINIC_OR_DEPARTMENT_OTHER): Payer: Medicaid Other | Admitting: Pain Medicine

## 2023-05-25 DIAGNOSIS — Z91199 Patient's noncompliance with other medical treatment and regimen due to unspecified reason: Secondary | ICD-10-CM

## 2023-05-25 DIAGNOSIS — G8929 Other chronic pain: Secondary | ICD-10-CM

## 2023-05-25 DIAGNOSIS — Z79899 Other long term (current) drug therapy: Secondary | ICD-10-CM

## 2023-05-25 DIAGNOSIS — Z789 Other specified health status: Secondary | ICD-10-CM

## 2023-05-25 DIAGNOSIS — G894 Chronic pain syndrome: Secondary | ICD-10-CM

## 2023-05-25 DIAGNOSIS — M899 Disorder of bone, unspecified: Secondary | ICD-10-CM

## 2023-10-08 ENCOUNTER — Other Ambulatory Visit: Payer: Self-pay

## 2023-10-08 DIAGNOSIS — M545 Low back pain, unspecified: Secondary | ICD-10-CM | POA: Insufficient documentation

## 2023-10-08 DIAGNOSIS — G8929 Other chronic pain: Secondary | ICD-10-CM | POA: Insufficient documentation

## 2023-10-08 NOTE — ED Triage Notes (Signed)
Pt to ed from home via POV for chronic back pain x 2 years. Pt has been followed at [facility] pain clinic until December when they dropped her bc of her injections not working anymore. Pt has been told she needs a stimulator placed in her back or surgery. Pt is caox4, in no acute distress and ambulatory. Pt placed in wheel chair.

## 2023-10-09 ENCOUNTER — Emergency Department
Admission: EM | Admit: 2023-10-09 | Discharge: 2023-10-09 | Disposition: A | Discharge status: Home / Self Care | Payer: Medicaid Other | Attending: Emergency Medicine | Admitting: Emergency Medicine

## 2023-10-09 DIAGNOSIS — M545 Low back pain, unspecified: Secondary | ICD-10-CM

## 2023-10-09 MED ORDER — KETOROLAC TROMETHAMINE 30 MG/ML IJ SOLN
30.0000 mg | Freq: Once | INTRAMUSCULAR | Status: AC
  Administered 2023-10-09: 30 mg via INTRAMUSCULAR
  Filled 2023-10-09: qty 1

## 2023-10-09 MED ORDER — LIDOCAINE 5 % EX PTCH
1.0000 | MEDICATED_PATCH | CUTANEOUS | Status: DC
  Administered 2023-10-09: 1 via TRANSDERMAL
  Filled 2023-10-09: qty 1

## 2023-10-09 MED ORDER — LIDOCAINE 5 % EX PTCH: 1.0000 | MEDICATED_PATCH | Freq: Two times a day (BID) | CUTANEOUS | 0 refills | Status: DC

## 2023-10-09 MED ORDER — MELOXICAM 15 MG PO TABS: 15.0000 mg | ORAL_TABLET | Freq: Every day | ORAL | 0 refills | Status: AC

## 2023-10-09 NOTE — Discharge Instructions (Signed)
Please follow-up with your pain clinics or the [facility] pain clinic, as well as with your regular primary care provider.  We recommend you continue to work with the orthopedic/spine specialist you have seen in the past as well and consider the spinal implant as you have previously discussed with them.  We refilled a prescription for you for meloxicam, but additional prescriptions will need to come from your regular providers.    Return to the emergency department if you develop new or worsening symptoms that concern you.

## 2023-10-09 NOTE — ED Provider Notes (Signed)
[facility] Provider Note    Event Date/Time   First MD Initiated Contact with Patient 10/09/23 0216     (approximate)   History   Back Pain (chronic)   HPI Sheila Valentine is a 43 y.o. 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  She presents tonight with worsening pain.  She was last seen at the [facility] pain clinic about 2 months ago where she was discharged from their practice.  The hydrocodone she was taking was not helping and they weaned her off of it.  They also noted that her UDS continues to test positive for cannabinoids.  She has spoken with orthopedics in [place] and they have recommended a spinal stimulator but she is not sure if she wants to proceed.  She has talked about going to other pain clinics in [facility] but has not yet done so.  She has an appointment with her primary care provider in the next 1 to 2 weeks but said the pain was worse tonight and she is not sure what she is supposed to do.  She formally took meloxicam but no longer has a prescription for it.  She has also taken Zanaflex in the past and has been through multiple different medications.  She is able to ambulate.  No urinary or bowel deficits.  No new numbness nor tingling.  No recent trauma.  Pain is as it has always been, just worsening since coming off of her medications in December.     Physical Exam   Triage Vital Signs: ED Triage Vitals [10/08/23 2341]  Encounter Vitals Group     BP (!) 173/106     Systolic BP Percentile      Diastolic BP Percentile      Pulse Rate 95     Resp 16     Temp 98.2 F (36.8 C)     Temp Source Oral     SpO2 100 %     Weight 81.6 kg (180 lb)     Height 1.549 m (5\' 1" )     Head Circumference      Peak Flow      Pain Score 10     Pain Loc      Pain Education      Exclude from Growth Chart     Most recent vital signs: Vitals:   10/08/23 2341  BP: (!) 173/106  Pulse: 95  Resp: 16  Temp: 98.2 F (36.8 C)   SpO2: 100%    General: Awake, appears uncomfortable but not in severe distress. CV:  Good peripheral perfusion.  Resp:  Normal effort. Speaking easily and comfortably, no accessory muscle usage nor intercostal retractions.   Abd:  No distention.  Other:  Patient reports tenderness to palpation of the lumbar spine although it does not seem to be localized and she guards however I press.  No palpable deformities or visible deformities.  Patient is ambulatory slowly but steadily.  No appreciable neurological deficits.   ED Results / Procedures / Treatments   Labs (all labs ordered are listed, but only abnormal results are displayed) Labs Reviewed - No data to display    PROCEDURES:  Critical Care performed: No  Procedures    IMPRESSION / MDM / ASSESSMENT AND PLAN / ED COURSE  I reviewed the triage vital signs and the nursing notes.  Differential diagnosis includes, but is not limited to, chronic back pain, cauda equina syndrome, herniated disc, osteomyelitis/discitis, transverse myelitis, epidural abscess/phlegmon.  Patient's presentation is most consistent with exacerbation of chronic illness.  Interventions/Medications given:  Medications  ketorolac (TORADOL) 30 MG/ML injection 30 mg (has no administration in time range)  lidocaine (LIDODERM) 5 % 1 patch (has no administration in time range)    (Note:  hospital course my include additional interventions and/or labs/studies not listed above.)   I reviewed multiple medical records including the pain clinic notes from [facility] in December and prior notes from other emergency department visits.  She has no warning signs or symptoms of an emergent cause of her low back pain.  She is in between providers at this time.  I reviewed the [place] controlled substance database and verified that she has not had a narcotic prescription since her [facility] pain clinic visit in December.  Explained to her  that I would treat her pain with nonopioid medications and ordered a Toradol 30 mg intramuscular injection as well as a Lidoderm patch.  I suggested that I could refill her meloxicam prescription and she was agreeable to this.  I gave her follow-up information about the [facility] pain clinic and encouraged her to follow-up with her primary care doctor and orthopedic specialist as well.  I gave my usual and customary return precautions.         FINAL CLINICAL IMPRESSION(S) / ED DIAGNOSES   Final diagnoses:  Chronic midline low back pain, unspecified whether sciatica present     Rx / DC Orders   ED Discharge Orders          Ordered    meloxicam (MOBIC) 15 MG tablet  Daily        10/09/23 0252    lidocaine (LIDODERM) 5 %  Every 12 hours        10/09/23 0252             Note:  This document was prepared using Dragon voice recognition software and may include unintentional dictation errors.   [name], MD 10/09/23 [phone]

## 2023-10-09 NOTE — ED Triage Notes (Signed)
Pt to desk to inquire about wait times, this RN explained delay. Pt then visualized walking slowly out of the ED, unsure if patient going to return at this time.

## 2023-12-13 ENCOUNTER — Encounter: Payer: Self-pay | Admitting: Student in an Organized Health Care Education/Training Program

## 2023-12-13 ENCOUNTER — Ambulatory Visit
Attending: Student in an Organized Health Care Education/Training Program | Admitting: Student in an Organized Health Care Education/Training Program

## 2023-12-13 VITALS — BP 157/100 | Temp 99.3°F | Resp 14 | Ht 61.0 in | Wt 194.0 lb

## 2023-12-13 DIAGNOSIS — M47816 Spondylosis without myelopathy or radiculopathy, lumbar region: Secondary | ICD-10-CM | POA: Insufficient documentation

## 2023-12-13 DIAGNOSIS — M51362 Other intervertebral disc degeneration, lumbar region with discogenic back pain and lower extremity pain: Secondary | ICD-10-CM | POA: Insufficient documentation

## 2023-12-13 DIAGNOSIS — G8929 Other chronic pain: Secondary | ICD-10-CM | POA: Insufficient documentation

## 2023-12-13 DIAGNOSIS — G894 Chronic pain syndrome: Secondary | ICD-10-CM | POA: Diagnosis present

## 2023-12-13 MED ORDER — KETOROLAC TROMETHAMINE 30 MG/ML IJ SOLN
30.0000 mg | Freq: Once | INTRAMUSCULAR | Status: AC
  Administered 2023-12-13: 30 mg via INTRAMUSCULAR
  Filled 2023-12-13: qty 1

## 2023-12-13 MED ORDER — METHOCARBAMOL 1000 MG/10ML IJ SOLN
200.0000 mg | Freq: Once | INTRAMUSCULAR | Status: AC
  Administered 2023-12-13: 200 mg via INTRAMUSCULAR
  Filled 2023-12-13: qty 10

## 2023-12-13 NOTE — Patient Instructions (Signed)
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
## 2024-02-06 ENCOUNTER — Emergency Department
Admission: EM | Admit: 2024-02-06 | Discharge: 2024-02-06 | Disposition: A | Discharge status: Home / Self Care | Attending: Emergency Medicine | Admitting: Emergency Medicine

## 2024-02-06 ENCOUNTER — Other Ambulatory Visit: Payer: Self-pay

## 2024-02-06 DIAGNOSIS — I10 Essential (primary) hypertension: Secondary | ICD-10-CM | POA: Diagnosis not present

## 2024-02-06 DIAGNOSIS — J45909 Unspecified asthma, uncomplicated: Secondary | ICD-10-CM | POA: Diagnosis not present

## 2024-02-06 DIAGNOSIS — G8929 Other chronic pain: Secondary | ICD-10-CM | POA: Insufficient documentation

## 2024-02-06 DIAGNOSIS — M5441 Lumbago with sciatica, right side: Secondary | ICD-10-CM | POA: Insufficient documentation

## 2024-02-06 MED ORDER — KETOROLAC TROMETHAMINE 10 MG PO TABS: 10.0000 mg | ORAL_TABLET | Freq: Four times a day (QID) | ORAL | 0 refills | Status: DC | PRN

## 2024-02-06 MED ORDER — KETOROLAC TROMETHAMINE 30 MG/ML IJ SOLN
30.0000 mg | Freq: Once | INTRAMUSCULAR | Status: AC
  Filled 2024-02-06: qty 1

## 2024-02-06 NOTE — ED Provider Notes (Signed)
 [facility] Provider Note    Event Date/Time   First MD Initiated Contact with Patient 02/06/24 2042     (approximate)   History   Chief Complaint Back Pain   HPI  Sheila Valentine is a 43 y.o. female with past medical history of hypertension, asthma, anemia, and chronic back pain who presents to the ED complaining of back pain.  Patient reports that over the past few days she has had a flareup of the same back pain she has dealt with for multiple years.  She describes sharp pain in the right side of her lower back that radiates down into her right leg.  She states that she was previously following with pain management at [facility], but has since left the clinic.  She has been following with her PCP for this problem, denies any new traumatic injuries.  She has not had any numbness or weakness in her legs, denies any numbness in her groin or incontinence.  She has been taking muscle relaxants without significant relief.     Physical Exam   Triage Vital Signs: ED Triage Vitals [02/06/24 1938]  Encounter Vitals Group     BP (!) 153/107     Girls Systolic BP Percentile      Girls Diastolic BP Percentile      Boys Systolic BP Percentile      Boys Diastolic BP Percentile      Pulse Rate (!) 101     Resp 18     Temp 98.7 F (37.1 C)     Temp Source Oral     SpO2 100 %     Weight 194 lb (88 kg)     Height 5' 1 (1.549 m)     Head Circumference      Peak Flow      Pain Score      Pain Loc      Pain Education      Exclude from Growth Chart     Most recent vital signs: Vitals:   02/06/24 1938  BP: (!) 153/107  Pulse: (!) 101  Resp: 18  Temp: 98.7 F (37.1 C)  SpO2: 100%    Constitutional: Alert and oriented. Eyes: Conjunctivae are normal. Head: Atraumatic. Nose: No congestion/rhinnorhea. Mouth/Throat: Mucous membranes are moist.  Cardiovascular: Normal rate, regular rhythm. Grossly normal heart sounds.  2+ radial and DP pulses  bilaterally. Respiratory: Normal respiratory effort.  No retractions. Lungs CTAB. Gastrointestinal: Soft and nontender. No distention. Musculoskeletal: No lower extremity tenderness nor edema.  Neurologic:  Normal speech and language. No gross focal neurologic deficits are appreciated.    ED Results / Procedures / Treatments   Labs (all labs ordered are listed, but only abnormal results are displayed) Labs Reviewed - No data to display   PROCEDURES:  Critical Care performed: No  Procedures   MEDICATIONS ORDERED IN ED: Medications  ketorolac  (TORADOL ) 30 MG/ML injection 30 mg (has no administration in time range)     IMPRESSION / MDM / ASSESSMENT AND PLAN / ED COURSE  I reviewed the triage vital signs and the nursing notes.                              43 y.o. female with past medical history of hypertension, asthma, anemia, and chronic back pain who presents to the ED for worsening right-sided back pain radiating down her right leg over the past couple of days.  Patient's presentation is most consistent with acute, uncomplicated illness.  Differential diagnosis includes, but is not limited to, chronic back pain, lumbar strain, lumbar radiculopathy, cauda equina.  Patient nontoxic-appearing and in no acute distress, vital signs are unremarkable.  She is neurovascular intact to her bilateral lower extremities and no findings concerning for cauda equina.  No recent trauma to necessitate imaging, will treat with IM Toradol .  Patient expresses understanding that we are unable to prescribe narcotic pain medication for chronic pain, she declines Lidoderm  patches and muscle relaxants as she has those at home.  She was given Toradol  to take and counseled to follow-up with her PCP as well as pain management.  She was counseled to return to the ED for new or worsening symptoms, patient agrees with plan.      FINAL CLINICAL IMPRESSION(S) / ED DIAGNOSES   Final diagnoses:  Chronic  midline low back pain with right-sided sciatica     Rx / DC Orders   ED Discharge Orders          Ordered    ketorolac  (TORADOL ) 10 MG tablet  Every 6 hours PRN        02/06/24 2113             Note:  This document was prepared using Dragon voice recognition software and may include unintentional dictation errors.   Twilla Galea, MD 02/06/24 2159
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
## 2024-04-12 ENCOUNTER — Emergency Department
Admission: EM | Admit: 2024-04-12 | Discharge: 2024-04-12 | Disposition: A | Discharge status: Home / Self Care | Attending: Emergency Medicine | Admitting: Emergency Medicine

## 2024-04-12 DIAGNOSIS — I10 Essential (primary) hypertension: Secondary | ICD-10-CM | POA: Diagnosis not present

## 2024-04-12 DIAGNOSIS — J45909 Unspecified asthma, uncomplicated: Secondary | ICD-10-CM | POA: Insufficient documentation

## 2024-04-12 DIAGNOSIS — R404 Transient alteration of awareness: Secondary | ICD-10-CM | POA: Insufficient documentation

## 2024-04-12 DIAGNOSIS — M5440 Lumbago with sciatica, unspecified side: Secondary | ICD-10-CM | POA: Diagnosis not present

## 2024-04-12 DIAGNOSIS — M549 Dorsalgia, unspecified: Secondary | ICD-10-CM | POA: Diagnosis present

## 2024-04-12 DIAGNOSIS — E876 Hypokalemia: Secondary | ICD-10-CM | POA: Insufficient documentation

## 2024-04-12 DIAGNOSIS — G8929 Other chronic pain: Secondary | ICD-10-CM | POA: Diagnosis not present

## 2024-04-12 LAB — COMPREHENSIVE METABOLIC PANEL WITH GFR
ALT: 40 U/L (ref 0–44)
AST: 39 U/L (ref 15–41)
Albumin: 3.3 g/dL — ABNORMAL LOW (ref 3.5–5.0)
Alkaline Phosphatase: 41 U/L (ref 38–126)
Anion gap: 14 (ref 5–15)
BUN: 11 mg/dL (ref 6–20)
CO2: 20 mmol/L — ABNORMAL LOW (ref 22–32)
Calcium: 8.9 mg/dL (ref 8.9–10.3)
Chloride: 101 mmol/L (ref 98–111)
Creatinine, Ser: 0.71 mg/dL (ref 0.44–1.00)
GFR, Estimated: >60 mL/min (ref >60)
Glucose, Bld: 80 mg/dL (ref 70–99)
Potassium: 2.5 mmol/L — CL (ref 3.5–5.1)
Sodium: 135 mmol/L (ref 135–145)
Total Bilirubin: 1 mg/dL (ref 0.0–1.2)
Total Protein: 6.8 g/dL (ref 6.5–8.1)

## 2024-04-12 LAB — CBC WITH DIFFERENTIAL/PLATELET
Abs Granulocyte: 2.9 K/uL (ref 1.5–6.5)
Abs Immature Granulocytes: 0.03 K/uL (ref 0.00–0.07)
Basophils Absolute: 0 K/uL (ref 0.0–0.1)
Basophils Relative: 0 %
Eosinophils Absolute: 0 K/uL (ref 0.0–0.5)
Eosinophils Relative: 1 %
HCT: 36 % (ref 36.0–46.0)
Hemoglobin: 11.9 g/dL — ABNORMAL LOW (ref 12.0–15.0)
Immature Granulocytes: 1 %
Lymphocytes Relative: 35 %
Lymphs Abs: 1.8 K/uL (ref 0.7–4.0)
MCH: 31 pg (ref 26.0–34.0)
MCHC: 33.1 g/dL (ref 30.0–36.0)
MCV: 93.8 fL (ref 80.0–100.0)
Monocytes Absolute: 0.4 K/uL (ref 0.1–1.0)
Monocytes Relative: 8 %
Neutro Abs: 2.9 K/uL (ref 1.7–7.7)
Neutrophils Relative %: 55 %
Platelets: 262 K/uL (ref 150–400)
RBC: 3.84 MIL/uL — ABNORMAL LOW (ref 3.87–5.11)
RDW: 13.1 % (ref 11.5–15.5)
Smear Review: NORMAL
WBC: 5.3 K/uL (ref 4.0–10.5)
nRBC: 0 % (ref 0.0–0.2)

## 2024-04-12 LAB — ACETAMINOPHEN LEVEL: Acetaminophen (Tylenol), Serum: <10 ug/mL — ABNORMAL LOW (ref 10–30)

## 2024-04-12 LAB — SALICYLATE LEVEL: Salicylate Lvl: <7 mg/dL — ABNORMAL LOW (ref 7.0–30.0)

## 2024-04-12 MED ORDER — POTASSIUM CHLORIDE 20 MEQ PO PACK
20.0000 meq | PACK | Freq: Once | ORAL | Status: DC
  Filled 2024-04-12: qty 1

## 2024-04-12 MED ORDER — POTASSIUM CHLORIDE CRYS ER 20 MEQ PO TBCR
40.0000 meq | EXTENDED_RELEASE_TABLET | Freq: Once | ORAL | Status: AC
Start: 2024-04-12 — End: 2024-04-12
  Administered 2024-04-12: 40 meq via ORAL
  Filled 2024-04-12: qty 2

## 2024-04-12 MED ORDER — POTASSIUM CHLORIDE 20 MEQ PO PACK
40.0000 meq | PACK | Freq: Once | ORAL | Status: AC
  Administered 2024-04-12: 40 meq via ORAL
  Filled 2024-04-12: qty 2

## 2024-04-12 NOTE — ED Notes (Signed)
 Pt's daughter Thersia People [phone] was updated on pt's care after receiving verbal consent from pt to give update to daughter.

## 2024-04-12 NOTE — ED Notes (Signed)
 Mother called for 19H  thought someone was trying to get her   Sheila Valentine [phone]

## 2024-04-12 NOTE — ED Notes (Signed)
 Pt's emergency contact, Rojelio Specking, updated about pt's care by this RN

## 2024-04-12 NOTE — ED Provider Notes (Signed)
 [facility] Provider Note   Event Date/Time   First MD Initiated Contact with Patient 04/12/24 1643     (approximate) History  Back Pain and Manic Behavior  HPI Sheila Valentine is a 43 y.o. female with a past medical history of chronic back pain who presents via EMS for altered mental status.  EMS states that they were originally called out for back pain however when they arrived the patient was taking her clothes off, taking out her nipple rings, and yelled, Lord he will my body.  During interview, patient only states I think I take too much oxycodone .  When asked what other medications may interact or what other medications she is on, she states it is in my chart.  Patient not answering any further questions ROS: Unable to assess   Physical Exam  Triage Vital Signs: ED Triage Vitals  Encounter Vitals Group     BP 04/12/24 1644 136/87     Girls Systolic BP Percentile --      Girls Diastolic BP Percentile --      Boys Systolic BP Percentile --      Boys Diastolic BP Percentile --      Pulse Rate 04/12/24 1644 90     Resp 04/12/24 1644 16     Temp 04/12/24 1644 97.9 F (36.6 C)     Temp src --      SpO2 04/12/24 1644 96 %     Weight 04/12/24 1646 191 lb 12.8 oz (87 kg)     Height 04/12/24 1646 5' 2 (1.575 m)     Head Circumference --      Peak Flow --      Pain Score 04/12/24 1646 0     Pain Loc --      Pain Education --      Exclude from Growth Chart --    Most recent vital signs: Vitals:   04/12/24 1649 04/12/24 2047  BP:  134/80  Pulse:  95  Resp:  17  Temp:  97.9 F (36.6 C)  SpO2: 97% 100%   General: Asleep and easily awoken to voice CV:  Good peripheral perfusion. Resp:  Normal effort. Abd:  No distention. Other:  Middle-aged obese African-American female resting comfortably in no acute distress ED Results / Procedures / Treatments  Labs (all labs ordered are listed, but only abnormal results are displayed) Labs Reviewed   ACETAMINOPHEN  LEVEL - Abnormal; Notable for the following components:      Result Value   Acetaminophen  (Tylenol ), Serum <10 (*)    All other components within normal limits  SALICYLATE LEVEL - Abnormal; Notable for the following components:   Salicylate Lvl <7.0 (*)    All other components within normal limits  CBC WITH DIFFERENTIAL/PLATELET - Abnormal; Notable for the following components:   RBC 3.84 (*)    Hemoglobin 11.9 (*)    All other components within normal limits  COMPREHENSIVE METABOLIC PANEL WITH GFR - Abnormal; Notable for the following components:   Potassium 2.5 (*)    CO2 20 (*)    Albumin 3.3 (*)    All other components within normal limits  CBC WITH DIFFERENTIAL/PLATELET  URINE DRUG SCREEN, QUALITATIVE ([facility] ONLY)   PROCEDURES: Critical Care performed: No Procedures MEDICATIONS ORDERED IN ED: Medications  potassium chloride  (KLOR-CON ) packet 20 mEq (20 mEq Oral Not Given 04/12/24 2145)  potassium chloride  SA (KLOR-CON  M) CR tablet 40 mEq (40 mEq Oral Given 04/12/24 1837)  potassium chloride  (  KLOR-CON ) packet 40 mEq (40 mEq Oral Given 04/12/24 1837)   IMPRESSION / MDM / ASSESSMENT AND PLAN / ED COURSE  I reviewed the triage vital signs and the nursing notes.                             The patient is on the cardiac monitor to evaluate for evidence of arrhythmia and/or significant heart rate changes. Patient's presentation is most consistent with acute presentation with potential threat to life or bodily function. Patient is a 43 year old female that presents for altered mental status after EMS was called out for back pain.  Patient received Haldol and Versed  in transit and patient arrives somnolent but easily awoken to voice. DDx: Sympathomimetic intoxication, opioid overdose, drug-induced psychosis Plan: Screening labs including CBC, CMP, Tylenol , acetaminophen , and urine drug screen. Will let patient metabolize to reassessment  Clinical Course as of 04/12/24  2237  Thu Apr 12, 2024  2133 Upon reassessment, patient is much less somnolent than upon arrival.  I discussed with patient her laboratory evaluation of a extremely low potassium at 2.5.  Patient was given potassium supplementation here.  Patient was offered repeat potassium level as well as admission if this were abnormal however patient requested discharge at this time.  Patient was also offered EKG which she states I just would like to go home.  Patient once again states that she may have inadvertently taken double dosages of her medications and this has caused her to be altered in the past.  Patient is alert and oriented x 4 at this time.  Dispo: Discharge home with PCP follow-up for recheck of her potassium [EB]    Clinical Course User Index [EB] Jossie Artist POUR, MD   FINAL CLINICAL IMPRESSION(S) / ED DIAGNOSES   Final diagnoses:  Hypokalemia  Transient alteration of awareness  Chronic low back pain with sciatica, sciatica laterality unspecified, unspecified back pain laterality   Rx / DC Orders   ED Discharge Orders     None      Note:  This document was prepared using Dragon voice recognition software and may include unintentional dictation errors.   Jossie Artist POUR, MD 04/12/24 [phone]

## 2024-04-12 NOTE — Discharge Instructions (Addendum)
 Your potassium level was 2.5 in the emergency department today.  You were given potassium replacement however since you have been discharged, you will need to follow-up with your primary care physician to make sure that this potassium level is going back to normal.  If not they may need to give you potassium supplementation.

## 2024-04-12 NOTE — ED Triage Notes (Signed)
 Pt BIB [facility] from home with reports of back pain. On arrival EMS states pt was screaming, Lord heal my body. Pt was ripping clothes off and uncooperative. EMS gave 5mg  haldol and 2mg  versed .  BGL 107.  Past Medical History:  Diagnosis Date   Anemia    Asthma    Hypertension    no meds

## 2024-04-29 ENCOUNTER — Emergency Department
Admission: EM | Admit: 2024-04-29 | Discharge: 2024-04-29 | Disposition: A | Discharge status: Home / Self Care | Attending: Emergency Medicine | Admitting: Emergency Medicine

## 2024-04-29 DIAGNOSIS — G8929 Other chronic pain: Secondary | ICD-10-CM | POA: Diagnosis not present

## 2024-04-29 DIAGNOSIS — I10 Essential (primary) hypertension: Secondary | ICD-10-CM | POA: Diagnosis not present

## 2024-04-29 DIAGNOSIS — M545 Low back pain, unspecified: Secondary | ICD-10-CM | POA: Diagnosis present

## 2024-04-29 DIAGNOSIS — M5441 Lumbago with sciatica, right side: Secondary | ICD-10-CM | POA: Diagnosis not present

## 2024-04-29 MED ORDER — LIDOCAINE 5 % EX PTCH
1.0000 | MEDICATED_PATCH | CUTANEOUS | Status: DC
  Administered 2024-04-29: 1 via TRANSDERMAL
  Filled 2024-04-29: qty 1

## 2024-04-29 MED ORDER — HYDROCHLOROTHIAZIDE 25 MG PO TABS: 25.0000 mg | ORAL_TABLET | Freq: Every day | ORAL | 2 refills | Status: AC

## 2024-04-29 MED ORDER — KETOROLAC TROMETHAMINE 15 MG/ML IJ SOLN
15.0000 mg | Freq: Once | INTRAMUSCULAR | Status: AC
  Administered 2024-04-29: 15 mg via INTRAMUSCULAR
  Filled 2024-04-29: qty 1

## 2024-04-29 MED ORDER — ACETAMINOPHEN 500 MG PO TABS
1000.0000 mg | ORAL_TABLET | Freq: Once | ORAL | Status: AC
  Administered 2024-04-29: 1000 mg via ORAL
  Filled 2024-04-29: qty 2

## 2024-04-29 MED ORDER — MELOXICAM 7.5 MG PO TABS: 7.5000 mg | ORAL_TABLET | Freq: Every day | ORAL | 2 refills | Status: AC

## 2024-04-29 MED ORDER — OXYCODONE HCL 5 MG PO TABS
5.0000 mg | ORAL_TABLET | Freq: Once | ORAL | Status: AC
  Administered 2024-04-29: 5 mg via ORAL
  Filled 2024-04-29: qty 1

## 2024-04-29 MED ORDER — OMEPRAZOLE 40 MG PO CPDR: 40.0000 mg | DELAYED_RELEASE_CAPSULE | Freq: Every day | ORAL | 2 refills | Status: AC
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
## 2024-04-29 NOTE — Discharge Instructions (Addendum)
 Please follow-up with your pain specialist.    I have sent refills of your hydrochlorothiazide , meloxicam  and omeprazole .  Return to the emergency department with worsening symptoms like numbness in your groin, loss of bladder or bowel control, inability to walk.

## 2024-05-21 ENCOUNTER — Emergency Department
Admission: EM | Admit: 2024-05-21 | Discharge: 2024-05-21 | Disposition: A | Discharge status: Home / Self Care | Attending: Emergency Medicine | Admitting: Emergency Medicine

## 2024-05-21 ENCOUNTER — Other Ambulatory Visit: Payer: Self-pay

## 2024-05-21 ENCOUNTER — Emergency Department

## 2024-05-21 DIAGNOSIS — S7001XA Contusion of right hip, initial encounter: Secondary | ICD-10-CM | POA: Insufficient documentation

## 2024-05-21 DIAGNOSIS — W19XXXA Unspecified fall, initial encounter: Secondary | ICD-10-CM

## 2024-05-21 DIAGNOSIS — M25551 Pain in right hip: Secondary | ICD-10-CM | POA: Diagnosis present

## 2024-05-21 DIAGNOSIS — W010XXA Fall on same level from slipping, tripping and stumbling without subsequent striking against object, initial encounter: Secondary | ICD-10-CM | POA: Insufficient documentation

## 2024-05-21 MED ORDER — ACETAMINOPHEN 325 MG PO TABS
650.0000 mg | ORAL_TABLET | Freq: Once | ORAL | Status: AC
  Administered 2024-05-21: 650 mg via ORAL
  Filled 2024-05-21: qty 2

## 2024-05-21 NOTE — Discharge Instructions (Signed)
 Your x-rays were normal.  Please follow-up with your outpatient provider.  Please return for any new, worsening, or change in symptoms or other concerns.
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
## 2024-05-21 NOTE — ED Provider Notes (Signed)
 [facility] Provider Note    Event Date/Time   First MD Initiated Contact with Patient 05/21/24 [phone]     (approximate)   History   Fall   HPI  Sheila Valentine is a 43 y.o. female who presents today after a fall.  Patient reports that her dog was playing with her waist strap and she tripped on it and landed on her tailbone and her right hip.  She reports that she did not strike her head or lose consciousness.  She was able to get up and has been ambulatory since the event.  She reports that she has pain to her hip and her tailbone only.  No other injury sustained.  No paresthesias.  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) 03/05/2015   Radiculopathy of lumbar region 03/05/2015          Physical Exam   Triage Vital Signs: ED Triage Vitals  Encounter Vitals Group     BP 05/21/24 0943 (!) 152/104     Girls Systolic BP Percentile --      Girls Diastolic BP Percentile --      Boys Systolic BP Percentile --      Boys Diastolic BP Percentile --      Pulse Rate 05/21/24 0943 (!) 106     Resp 05/21/24 0943 19     Temp 05/21/24 0943 98.7 F (37.1 C)     Temp src --      SpO2 05/21/24 0943 100 %     Weight 05/21/24 0942 193 lb (87.5 kg)     Height 05/21/24 0942 5' 1 (1.549 m)     Head Circumference --      Peak Flow --      Pain Score 05/21/24 0942 10     Pain Loc --      Pain Education --      Exclude from Growth Chart --      Most recent vital signs: Vitals:   05/21/24 0943  BP: (!) 152/104  Pulse: (!) 106  Resp: 19  Temp: 98.7 F (37.1 C)  SpO2: 100%    Physical Exam Vitals and nursing note reviewed.  Constitutional:      General: Awake and alert. No acute distress.    Appearance: Normal appearance. The patient is normal weight.  HENT:     Head: Normocephalic and atraumatic.     Mouth: Mucous membranes are moist.  Eyes:     General: PERRL. Normal EOMs        Right eye: No discharge.        Left eye: No discharge.     Conjunctiva/sclera: Conjunctivae normal.  Cardiovascular:     Rate and Rhythm: Normal rate and regular rhythm.     Pulses: Normal pulses.  Pulmonary:     Effort: Pulmonary effort is normal. No respiratory distress.     Breath sounds: Normal breath sounds.  Abdominal:     Abdomen is soft. There is no abdominal  tenderness. No rebound or guarding. No distention. Musculoskeletal:        General: No swelling. Normal range of motion.     Cervical back: Normal range of motion and neck supple. Pelvis stable.  Able to externally and internally rotate bilateral hips against resistance.  Able to flex and extend bilateral hips against resistance.  Normal gait.  No overt Skin:    General: Skin is warm and dry.     Capillary Refill: Capillary refill takes less than 2 seconds.     Findings: No rash.  Neurological:     Mental Status: The patient is awake and alert.      ED Results / Procedures / Treatments   Labs (all labs ordered are listed, but only abnormal results are displayed) Labs Reviewed - No data to display   EKG     RADIOLOGY I independently reviewed and interpreted imaging and agree with radiologists findings.     PROCEDURES:  Critical Care performed:   Procedures   MEDICATIONS ORDERED IN ED: Medications  acetaminophen  (TYLENOL ) tablet 650 mg (650 mg Oral Given 05/21/24 1010)     IMPRESSION / MDM / ASSESSMENT AND PLAN / ED COURSE  I reviewed the  triage vital signs and the nursing notes.   Differential diagnosis includes, but is not limited to, contusion, less likely fracture or dislocation  I reviewed the patient's chart.  Patient has chronic tailbone pain where she believes that this was related to an incident where she was dragged by her dog while walking.  She follows up with her outpatient provider regarding this.  She also experiences muscle spasms on her right side.    Patient is awake and alert, nontoxic in appearance.  She has full and normal range of motion of her hip, though mild tenderness over her greater trochanter where she fell.  X-ray hip and coccyx obtained are normal.  Patient is reassured by these findings.  She was treated symptomatically with Tylenol  with good effect.  We discussed return precautions and outpatient follow-up.  Patient or stands and agrees with plan.  She was discharged in stable condition.   Patient's presentation is most consistent with acute complicated illness / injury requiring diagnostic workup.    FINAL CLINICAL IMPRESSION(S) / ED DIAGNOSES   Final diagnoses:  Contusion of right hip, initial encounter  Fall, initial encounter     Rx / DC Orders   ED Discharge Orders     None        Note:  This document was prepared using Dragon voice recognition software and may include unintentional dictation errors.   Ashantee Deupree E, PA-C 05/21/24 1437    Suzanne Kirsch, MD 05/21/24 [phone]

## 2024-06-11 NOTE — ED Provider Notes (Signed)
 [facility] Emergency Department Provider Note    ED Clinical Impression    Final diagnoses:  Constipation, unspecified constipation type (Primary)  Acute exacerbation of chronic low back pain        Impression, Medical Decision Making, Progress Notes and Critical Care    Impression, Differential Diagnosis and Plan of Care  This is an afebrile, nontoxic-appearing 43 year old female with history of chronic lower back pain with no recent injury and seem multiple modalities of interventions including spine center, pain management at both [facility] and [facility], has referral to [facility] pain management, was seen at the [facility] spine center and had MRI pelvis ordered however note that it was canceled.  In the referral documentation notes she has a pain management contract with [place].  Discussed what was going on with the patient after she was roomed and had ordered some gabapentin  as this was not in her pain regimen to offer while performing her workup.  This seemed to make the patient upset she says she was not passed on second help.  She also became more upset when provider noted that opioids do not treat back pain.  Patient had noted that opioids only thing that helped her back pain and if were not can help her then she was just cannot leave.  Says nobody ever treats her for her pain and that we do not care about her pain.  Patient was told that is not true and if she wants to leave if she can find to leave the ED at this time.  No AMA was signed.  Portions of this record have been created using Scientist, clinical (histocompatibility and immunogenetics). Dictation errors have been sought, but may not have been identified and corrected.  See chart and resident provider documentation for details.  ____________________________________________      History     Reason for Visit Back Pain    HPI  Sheila Valentine is a 43 y.o. female senting for further evaluation and continue chronic back pain with recent MRI  of her lumbar spine that showed no concerning abnormalities to explain her symptoms.  She did have spine center ordered a pelvis MRI however this was retracted and she was not able to obtain it for some reason that she states was with her insurance however notes something about pain management clinic at [facility] in the referral note.   External Records Reviewed (Inpatient/Outpatient notes, Prior labs/imaging studies, Care Everywhere, PDMP, External ED notes, etc)  Reviewed previous notes, labs, urgent care workup PDMP for pertinent patient that visit. Past Medical History[1]  Problem List[2]  Past Surgical History[3]   Current Facility-Administered Medications:  .  ibuprofen  (MOTRIN ) tablet 800 mg, 800 mg, Oral, Once, Cleotilde Loa Ruth, MD .  lidocaine  (ASPERCREME) 4 % 1 patch, 1 patch, Transdermal, Once, Cleotilde Loa Ruth, MD, 1 patch at 06/11/24 2225  Current Outpatient Medications:  .  acetaminophen  (TYLENOL ) 500 MG tablet, Take 1 tablet (500 mg total) by mouth., Disp: , Rfl:  .  amitriptyline (ELAVIL) 50 MG tablet, Take by mouth Continuous., Disp: , Rfl:  .  ascorbic acid, vitamin C, (VITAMIN C) 500 MG tablet, Take 1 tablet (500 mg total) by mouth daily., Disp: , Rfl:  .  cranberry extract (CRANBERRY CONCENTRATE) 500 mg cap, , Disp: , Rfl:  .  hydroCHLOROthiazide  (HYDRODIURIL ) 25 MG tablet, Take 1 tablet (25 mg total) by mouth daily., Disp: , Rfl:  .  ketorolac  (TORADOL ) 10 mg tablet, Take 1 tablet (10 mg total)  by mouth every six (6) hours as needed., Disp: , Rfl:  .  lidocaine  (LIDODERM ) 5 % patch, Place 1 patch on the skin., Disp: , Rfl:  .  meloxicam  (MOBIC ) 7.5 MG tablet, Take 1 tablet (7.5 mg total) by mouth daily., Disp: , Rfl:  .  omeprazole  (PRILOSEC) 40 MG capsule, Take 1 capsule (40 mg total) by mouth Continuous., Disp: , Rfl:  .  oxyCODONE  (ROXICODONE ) 5 MG immediate release tablet, Take 1 tablet (5 mg total) by mouth every four (4) hours as needed for pain (not relieved by  scheduled tylenol ) for up to 5 doses., Disp: 5 tablet, Rfl: 0 .  potassium chloride  10 MEQ ER tablet, Take 1 tablet (10 mEq total) by mouth., Disp: , Rfl:  .  predniSONE  (DELTASONE ) 10 MG tablet, TAKE 4 TABLETS BY MOUTH ONCE DAILY FOR 2 DAYS AND 3 ONCE DAILY FOR 2 DAYS AND 2 ONCE DAILY FOR 2 DAYS AND 1 ONCE DAILY FOR 2 DAYS, Disp: , Rfl:  .  QVAR REDIHALER 80 mcg/actuation inhaler, Inhale 2 puffs every twelve (12) hours., Disp: , Rfl:  .  tizanidine (ZANAFLEX) 4 MG tablet, Take by mouth every six (6) hours., Disp: , Rfl:   Allergies Amoxicillin and Flagyl  [metronidazole ]  Family History[4]  Social History Short Social History[5]    Physical Exam   ED Triage Vitals [06/11/24 1933]  Enc Vitals Group     BP 139/83     Pulse 104     SpO2 Pulse 104     Resp 16     Temp 36.4 C (97.5 F)     Temp src      SpO2 97 %     Weight 87.5 kg (193 lb)     Height 1.575 m (5' 2)     Head Circumference      Peak Flow      Pain Score      Pain Loc      Pain Education      Exclude from Growth Chart     Radiology   XR Lumbar Spine 2 or 3 Views  Final Result  1.  No acute fracture or traumatic listhesis of the lumbar spine.  2.  Mild disc and facet joint degenerative changes at L5-S1.  3.  Moderate colonic stool burden.              [1] No past medical history on file. [2] There is no problem list on file for this patient. [3] No past surgical history on file. [4] No family history on file. [5]    Lennie Donnice [place], PA 06/11/24 [phone]

## 2024-06-11 NOTE — ED Triage Notes (Signed)
 Patient reports lower right back pain that radiates down right leg. Hx sciatic nerve pain.

## 2024-06-28 NOTE — Progress Notes (Signed)
 Complex Case Management SUMMARY NOTE  Care Management Assistant spoke with patient's parent   and verified correct patient using two identifiers today to introduce the Complex Case Management program.    Program status: Pt currently not available. Contact information provided for callback.

## 2024-07-02 NOTE — Progress Notes (Signed)
 Complex Case Management  SUMMARY NOTE    Attempted to contact pt today at Cell number to introduce Complex Case Management services. No answer, unable to leave message; 2nd attempt, letter sent.    Discuss at next visit: No answer, letter sent

## 2024-07-09 NOTE — ED Provider Notes (Signed)
 LWBS/AMA Patient Follow-Up Call  I attempted to contact patient at [phone] to discuss ED visit after AMA. Mother answered phone and said she would let patient know I called.

## 2024-07-11 ENCOUNTER — Other Ambulatory Visit: Payer: Self-pay

## 2024-07-11 ENCOUNTER — Emergency Department
Admission: EM | Admit: 2024-07-11 | Discharge: 2024-07-11 | Disposition: A | Discharge status: Home / Self Care | Attending: Emergency Medicine | Admitting: Emergency Medicine

## 2024-07-11 DIAGNOSIS — G8929 Other chronic pain: Secondary | ICD-10-CM | POA: Diagnosis not present

## 2024-07-11 DIAGNOSIS — M545 Low back pain, unspecified: Secondary | ICD-10-CM | POA: Diagnosis present

## 2024-07-11 DIAGNOSIS — I10 Essential (primary) hypertension: Secondary | ICD-10-CM | POA: Insufficient documentation

## 2024-07-11 DIAGNOSIS — M5441 Lumbago with sciatica, right side: Secondary | ICD-10-CM | POA: Insufficient documentation

## 2024-07-11 MED ORDER — HYDROCODONE-ACETAMINOPHEN 5-325 MG PO TABS: 1.0000 | ORAL_TABLET | Freq: Four times a day (QID) | ORAL | 0 refills | Status: AC | PRN

## 2024-07-11 MED ORDER — DEXAMETHASONE SOD PHOSPHATE PF 10 MG/ML IJ SOLN
10.0000 mg | Freq: Once | INTRAMUSCULAR | Status: AC
  Administered 2024-07-11: 10 mg via INTRAMUSCULAR

## 2024-07-11 MED ORDER — LIDOCAINE 5 % EX PTCH: 1.0000 | MEDICATED_PATCH | CUTANEOUS | 0 refills | Status: AC

## 2024-07-11 MED ORDER — LIDOCAINE 5 % EX PTCH
1.0000 | MEDICATED_PATCH | CUTANEOUS | Status: DC
  Administered 2024-07-11: 1 via TRANSDERMAL
  Filled 2024-07-11: qty 1

## 2024-07-11 NOTE — ED Notes (Signed)
 Pt is c/o tailbone pain, sciatic pain, restless leg, that has been bothering them for several weeks. Pt has a hx of pain and has been seen by the [facility] pain clinic but stopped working with them after a bad experience with a spine shot. Pt tried using [facility] pain clinic but their insurance was not accepted there. Pt stated that they were treated poorly at [facility] and won't go back there. Pt has been dealing with back pain for 4+ years. Pt states that stairs are tough for them, and heat and cold therapies are not working for them.

## 2024-07-11 NOTE — ED Triage Notes (Signed)
 Patient states pain to low back/tailbone area x two years; had MRI scheduled at [facility] a few weeks ago but was cancelled when patient arrived.

## 2024-07-11 NOTE — Discharge Instructions (Signed)
 Please follow-up with your primary care provider in regards to the chronic pain management appointment.    I have sent a strong pain medication called Norco to the pharmacy.  This medication can be taken every 4-6 hours as needed for severe or breakthrough pain.  This medication can cause dependency so only take if you are unable to control your pain with other medications.  It will make you sleepy so do not drive or operate heavy machinery after taking it.  Do not drink alcohol while taking this medication.  This medication can also cause constipation so please take an over-the-counter stool softener like MiraLAX or Colace while taking it.  This medication contains both hydrocodone  and acetaminophen .  Do not take Tylenol  at the same time.  You can take the Norco or Tylenol  but not both.  Return to the ED or contact your doctor right away if you experience any of the following: New weakness, numbness or tingling in your legs or feet Loss of control of your bladder or bowels (trouble peeing or pooping, or accidents) Severe pain that does not improve or gets much worse Fever, chills or unexplained weight loss Pain after a fall, injury or trauma Difficulty walking or standing

## 2024-07-11 NOTE — ED Provider Notes (Signed)
 [facility] Provider Note    Event Date/Time   First MD Initiated Contact with Patient 07/11/24 1024     (approximate)   History   Tailbone Pain   HPI  Sheila Valentine is a 43 y.o. female with PMH of chronic low back pain with sciatica, chronic pain syndrome, hypertension and asthma presents for evaluation of low back pain.  Patient tried to get into see her primary care provider but they did not have appointments.  She then tried to go to the [facility] but did not have money to cover the co-pay so she came to the emergency department for further evaluation.  Patient reports symptoms consistent with her chronic low back pain.  Her primary care provider is working on getting her into a pain clinic as she was dismissed from her previous 1.  No new falls, injuries.  No loss of bladder or bowel control.  No fevers.      Physical Exam   Triage Vital Signs: ED Triage Vitals  Encounter Vitals Group     BP 07/11/24 0945 (!) 155/111     Girls Systolic BP Percentile --      Girls Diastolic BP Percentile --      Boys Systolic BP Percentile --      Boys Diastolic BP Percentile --      Pulse Rate 07/11/24 0945 94     Resp 07/11/24 0945 18     Temp 07/11/24 0945 97.8 F (36.6 C)     Temp src --      SpO2 07/11/24 0945 94 %     Weight 07/11/24 0943 189 lb (85.7 kg)     Height 07/11/24 0943 5' 1 (1.549 m)     Head Circumference --      Peak Flow --      Pain Score 07/11/24 0943 9     Pain Loc --      Pain Education --      Exclude from Growth Chart --     Most recent vital signs: Vitals:   07/11/24 0945  BP: (!) 155/111  Pulse: 94  Resp: 18  Temp: 97.8 F (36.6 C)  SpO2: 94%   General: Awake, uncomfortable, pacing around the room, cannot sit still due to her pain. CV:  Good peripheral perfusion.  RRR. Resp:  Normal effort.  CTAB. Abd:  No distention.  Other:  Tender to palpation throughout the lower spine, sensation in bilateral lower  extremities is intact, dorsalis pedis pulse 2+ and regular, strength is slightly decreased in the right side, patellar tendon reflex 2+ on both sides, patient is able to walk without difficulty.   ED Results / Procedures / Treatments   Labs (all labs ordered are listed, but only abnormal results are displayed) Labs Reviewed - No data to display   PROCEDURES:  Critical Care performed: No  Procedures   MEDICATIONS ORDERED IN ED: Medications  lidocaine  (LIDODERM ) 5 % 1 patch (1 patch Transdermal Patch Applied 07/11/24 1127)  dexamethasone  (DECADRON ) injection 10 mg (10 mg Intramuscular Given 07/11/24 1127)     IMPRESSION / MDM / ASSESSMENT AND PLAN / ED COURSE  I reviewed the triage vital signs and the nursing notes.                             43 year old female presents for evaluation of chronic low back pain.  Blood pressure is elevated and patient  is quite uncomfortable on exam, vital signs stable otherwise.  Differential diagnosis includes, but is not limited to, chronic back pain, sciatica, lumbar radiculopathy, acute spinal cord injury less likely.  Patient's presentation is most consistent with acute, uncomplicated illness.  Patient reports pain consistent with her chronic low back pain and is here for pain management as she has not been able to get in to see a pain specialist.  She denies any new injuries, falls or trauma.  She has not had any red flag symptoms of back pain.  Do not feel that imaging is indicated at this time.  Patient is already taking an anti-inflammatory, Tylenol , muscle relaxer and duloxetine.  Will refill patient's prescription for lidocaine  patches that she does not currently have topical pain relief and we will give her a steroid injection while in the emergency department.  Will give her 3 days of pain medication.  Explained that she will need to get further pain management from the pain clinic as we are limited in the amount of pain medication we can  prescribe from the ED.  Patient did not get a note for work because she is not currently working.  Advised her to follow-up with her primary care provider.  She voiced understanding, questions were answered and she is stable at discharge.    FINAL CLINICAL IMPRESSION(S) / ED DIAGNOSES   Final diagnoses:  Chronic right-sided low back pain with right-sided sciatica     Rx / DC Orders   ED Discharge Orders          Ordered    lidocaine  (LIDODERM ) 5 %  Every 24 hours        07/11/24 1124    HYDROcodone -acetaminophen  (NORCO/VICODIN) 5-325 MG tablet  Every 6 hours PRN        07/11/24 1124             Note:  This document was prepared using Dragon voice recognition software and may include unintentional dictation errors.   Cleaster Tinnie LABOR, PA-C 07/11/24 1136    Dicky Anes, MD 07/11/24 1626

## 2024-07-11 NOTE — ED Notes (Signed)
 Pt discharged to home, instructions and medications reviewed.  Pt verbalized understanding, has no questions at this time.

## 2024-07-25 ENCOUNTER — Other Ambulatory Visit: Payer: Self-pay

## 2024-07-25 ENCOUNTER — Encounter: Payer: Self-pay | Admitting: *Deleted

## 2024-07-25 ENCOUNTER — Emergency Department
Admission: EM | Admit: 2024-07-25 | Discharge: 2024-07-25 | Disposition: A | Discharge status: Home / Self Care | Attending: Emergency Medicine | Admitting: Emergency Medicine

## 2024-07-25 DIAGNOSIS — G8929 Other chronic pain: Secondary | ICD-10-CM | POA: Insufficient documentation

## 2024-07-25 DIAGNOSIS — M5441 Lumbago with sciatica, right side: Secondary | ICD-10-CM | POA: Diagnosis present

## 2024-07-25 MED ORDER — DEXAMETHASONE SOD PHOSPHATE PF 10 MG/ML IJ SOLN
10.0000 mg | Freq: Once | INTRAMUSCULAR | Status: AC
  Administered 2024-07-25: 10 mg via INTRAMUSCULAR

## 2024-07-25 MED ORDER — LIDOCAINE 5 % EX PTCH: 1.0000 | MEDICATED_PATCH | CUTANEOUS | 0 refills | Status: AC

## 2024-07-25 NOTE — ED Triage Notes (Signed)
 Pt ambulatory to triage, reporting back and leg spasms. Has chronic back pain. She saw her doctor 1 week ago for the same, referred to pain clinic at [facility]. Taking naproxen  and tylenol  for pain.

## 2024-07-25 NOTE — ED Notes (Signed)
 Pt requesting to see another doctor. Notified patient that she would not be able to see another MD unless she checks back in and placed in another MD's section. Pt ambulated out of department independently.

## 2024-07-25 NOTE — ED Provider Notes (Signed)
 [facility] Provider Note    Event Date/Time   First MD Initiated Contact with Patient 07/25/24 2219     (approximate)   History   Back Pain   HPI  Sheila Valentine is a 43 y.o. female who presents to the emergency department today because of concerns for chronic right low back and leg pain.  The patient has had this pain for a long time.  She has been a previous patient of multiple local pain clinics however currently is awaiting referral to [facility] pain clinic to establish care there.  She states that she went to outside emergency department earlier today however left after they only offered to gabapentin , Lidoderm  patch and naproxen .  The patient denies any issues with urination or defecation except she says sometimes it is hard for her to get to the bathroom in time because of the pain.     Physical Exam   Triage Vital Signs: ED Triage Vitals  Encounter Vitals Group     BP 07/25/24 2104 (!) 138/102     Girls Systolic BP Percentile --      Girls Diastolic BP Percentile --      Boys Systolic BP Percentile --      Boys Diastolic BP Percentile --      Pulse Rate 07/25/24 2104 89     Resp 07/25/24 2104 16     Temp 07/25/24 2104 98.3 F (36.8 C)     Temp Source 07/25/24 2104 Oral     SpO2 07/25/24 2104 100 %     Weight --      Height --      Head Circumference --      Peak Flow --      Pain Score 07/25/24 2103 10     Pain Loc --      Pain Education --      Exclude from Growth Chart --     Most recent vital signs: Vitals:   07/25/24 2104  BP: (!) 138/102  Pulse: 89  Resp: 16  Temp: 98.3 F (36.8 C)  SpO2: 100%   General: Awake, alert, oriented. CV:  Good peripheral perfusion.  Resp:  Normal effort.  Abd:  No distention.    ED Results / Procedures / Treatments   Labs (all labs ordered are listed, but only abnormal results are displayed) Labs Reviewed - No data to display   EKG  None   RADIOLOGY None   PROCEDURES:  Critical  Care performed: No    MEDICATIONS ORDERED IN ED: Medications  dexamethasone  (DECADRON ) injection 10 mg (10 mg Intramuscular Given 07/25/24 2243)     IMPRESSION / MDM / ASSESSMENT AND PLAN / ED COURSE  I reviewed the triage vital signs and the nursing notes.                              Differential diagnosis includes, but is not limited to, chronic back pain, sciatica.  Patient's presentation is most consistent with acute presentation with potential threat to life or bodily function.   Patient presents to the emergency department today because of concerns for chronic back pain.  Does have sciatica to the right leg.  Patient denies any urinary or defecatory red flags.  I have extremely low suspicion for cauda equina.  At this time I do think it is likely patient's chronic back pain.  Did discuss with patient that be happy to refill  Lidoderm  patch however would not refill narcotic prescriptions for chronic pain.  She is working on following up with [facility] pain clinic which I think is a good idea.  Unfortunately she states that she has already been to [place] pain clinic but left so that would not be an option for referral.  Will give patient shot of Decadron  here to help with inflammation.     FINAL CLINICAL IMPRESSION(S) / ED DIAGNOSES   Final diagnoses:  Chronic right-sided low back pain with right-sided sciatica    Note:  This document was prepared using Dragon voice recognition software and may include unintentional dictation errors.    Floy Roberts, MD 07/25/24 [phone]

## 2024-08-03 ENCOUNTER — Other Ambulatory Visit: Payer: Self-pay | Admitting: Family Medicine

## 2024-08-03 DIAGNOSIS — Z1231 Encounter for screening mammogram for malignant neoplasm of breast: Secondary | ICD-10-CM
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
## 2024-09-05 ENCOUNTER — Inpatient Hospital Stay
Admission: RE | Admit: 2024-09-05 | Discharge: 2024-09-05 | Disposition: A | Discharge status: Home / Self Care | Payer: Self-pay | Source: Ambulatory Visit | Attending: Neurosurgery | Admitting: Neurosurgery

## 2024-09-05 ENCOUNTER — Other Ambulatory Visit: Payer: Self-pay | Admitting: Family Medicine

## 2024-09-05 DIAGNOSIS — Z049 Encounter for examination and observation for unspecified reason: Secondary | ICD-10-CM

## 2024-09-06 ENCOUNTER — Ambulatory Visit: Admitting: Physician Assistant

## 2024-09-06 ENCOUNTER — Encounter: Payer: Self-pay | Admitting: Physician Assistant

## 2024-09-06 VITALS — BP 120/76 | Ht 61.0 in | Wt 181.0 lb

## 2024-09-06 DIAGNOSIS — M5441 Lumbago with sciatica, right side: Secondary | ICD-10-CM

## 2024-09-06 DIAGNOSIS — G894 Chronic pain syndrome: Secondary | ICD-10-CM | POA: Diagnosis not present

## 2024-09-06 DIAGNOSIS — G8929 Other chronic pain: Secondary | ICD-10-CM

## 2024-09-10 ENCOUNTER — Other Ambulatory Visit: Payer: Self-pay

## 2024-09-10 ENCOUNTER — Other Ambulatory Visit: Payer: Self-pay | Admitting: Physician Assistant

## 2024-09-10 ENCOUNTER — Encounter: Payer: Self-pay | Admitting: Neurology

## 2024-09-10 DIAGNOSIS — G894 Chronic pain syndrome: Secondary | ICD-10-CM

## 2024-09-10 DIAGNOSIS — R202 Paresthesia of skin: Secondary | ICD-10-CM

## 2024-10-16 ENCOUNTER — Ambulatory Visit: Admitting: Student in an Organized Health Care Education/Training Program

## 2024-11-02 ENCOUNTER — Encounter: Payer: Self-pay | Admitting: Neurology
